# Patient Record
Sex: Male | Born: 1970 | Race: Black or African American | Hispanic: No | Marital: Married | Smoking: Never smoker
Health system: Southern US, Community
[De-identification: ages and names within clinical notes are randomized; demographics above are authoritative.]

## PROBLEM LIST (undated history)

## (undated) DIAGNOSIS — Z789 Other specified health status: Secondary | ICD-10-CM

## (undated) HISTORY — PX: WRIST SURGERY: SHX841

## (undated) HISTORY — PX: TONSILLECTOMY: SUR1361

---

## 2005-04-15 ENCOUNTER — Emergency Department: Payer: Self-pay | Admitting: Unknown Physician Specialty

## 2012-01-03 ENCOUNTER — Emergency Department: Payer: Self-pay | Admitting: Emergency Medicine

## 2015-01-28 ENCOUNTER — Ambulatory Visit: Payer: Worker's Compensation

## 2015-01-28 ENCOUNTER — Encounter: Payer: Self-pay | Admitting: Emergency Medicine

## 2015-01-28 ENCOUNTER — Ambulatory Visit: Admission: EM | Admit: 2015-01-28 | Discharge: 2015-01-28 | Discharge status: Absent without leave | Payer: Self-pay

## 2015-01-28 ENCOUNTER — Ambulatory Visit
Admission: EM | Admit: 2015-01-28 | Discharge: 2015-01-28 | Disposition: A | Discharge status: Home / Self Care | Payer: Worker's Compensation | Attending: Internal Medicine | Admitting: Internal Medicine

## 2015-01-28 DIAGNOSIS — M79601 Pain in right arm: Secondary | ICD-10-CM | POA: Diagnosis present

## 2015-01-28 DIAGNOSIS — M70811 Other soft tissue disorders related to use, overuse and pressure, right shoulder: Secondary | ICD-10-CM | POA: Diagnosis not present

## 2015-01-28 DIAGNOSIS — M7581 Other shoulder lesions, right shoulder: Secondary | ICD-10-CM | POA: Diagnosis not present

## 2015-01-28 DIAGNOSIS — M654 Radial styloid tenosynovitis [de Quervain]: Secondary | ICD-10-CM | POA: Diagnosis not present

## 2015-01-28 IMAGING — CR DG SHOULDER 2+V*R*
4 series · 4 of 4 positions shown · non-contrast
Comparison: None

CLINICAL DATA: Subacromial pain, pain at RIGHT humeral head
posteriorly and at lateral border of RIGHT scapular for 1 month in,
has had intermittent pain for 3 years, repetitive and movements at
work, no specific injury

EXAM:
RIGHT SHOULDER - 2+ VIEW

[shoulder axial (1 of 4)]
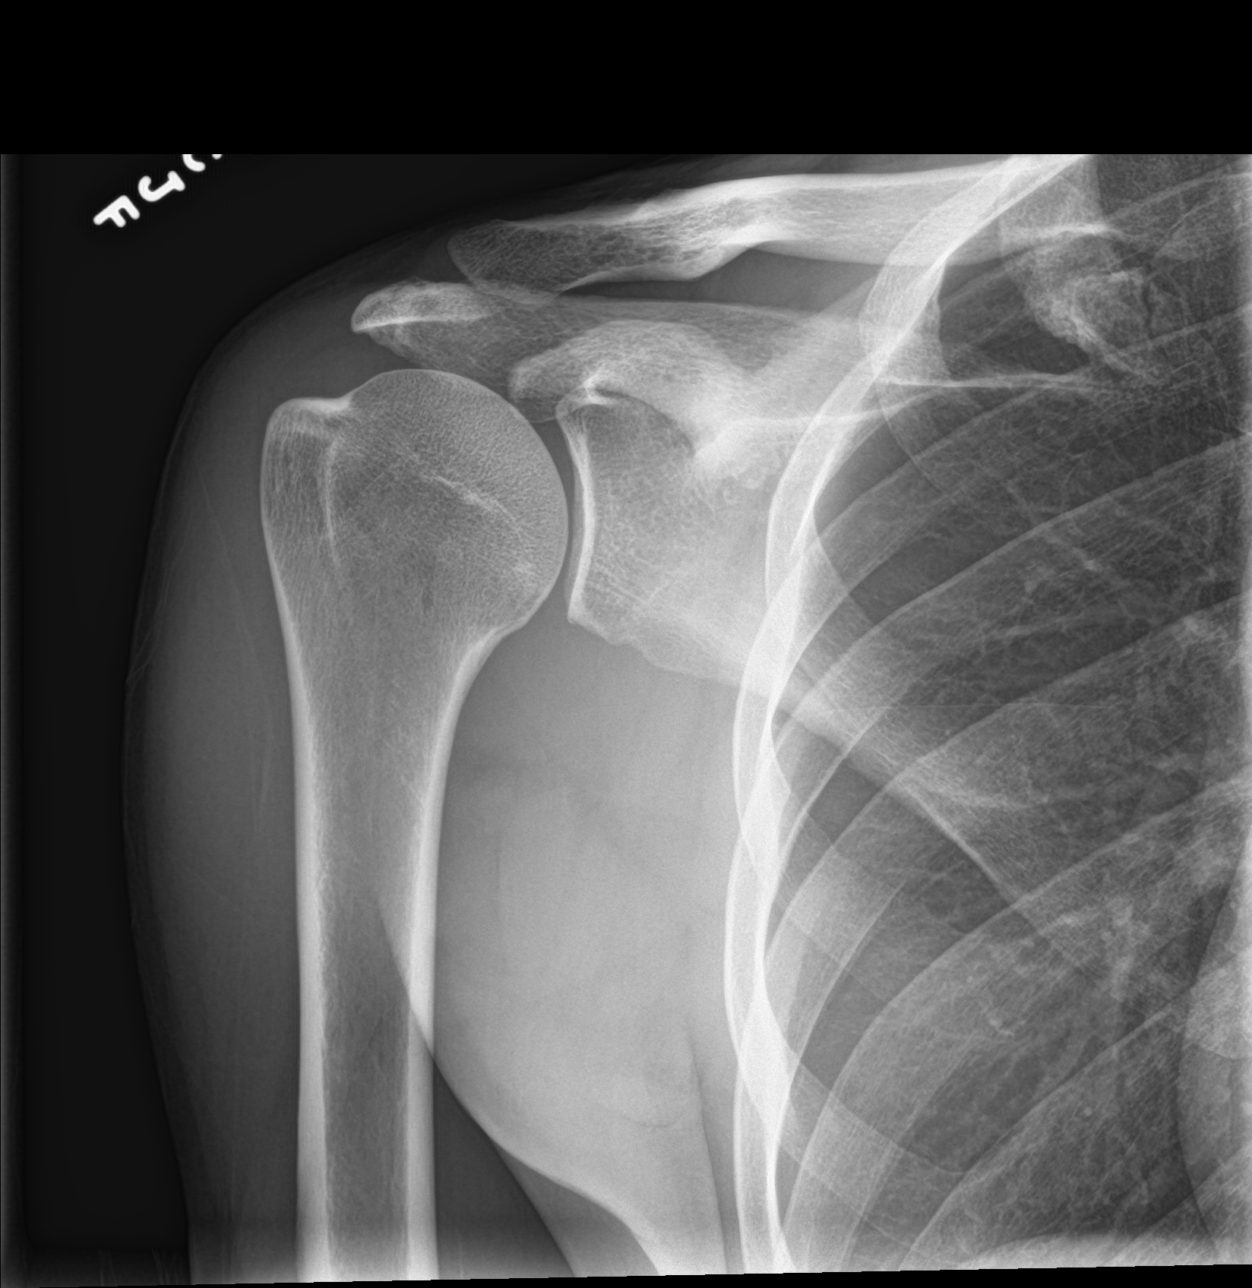

[shoulder axial (2 of 4)]
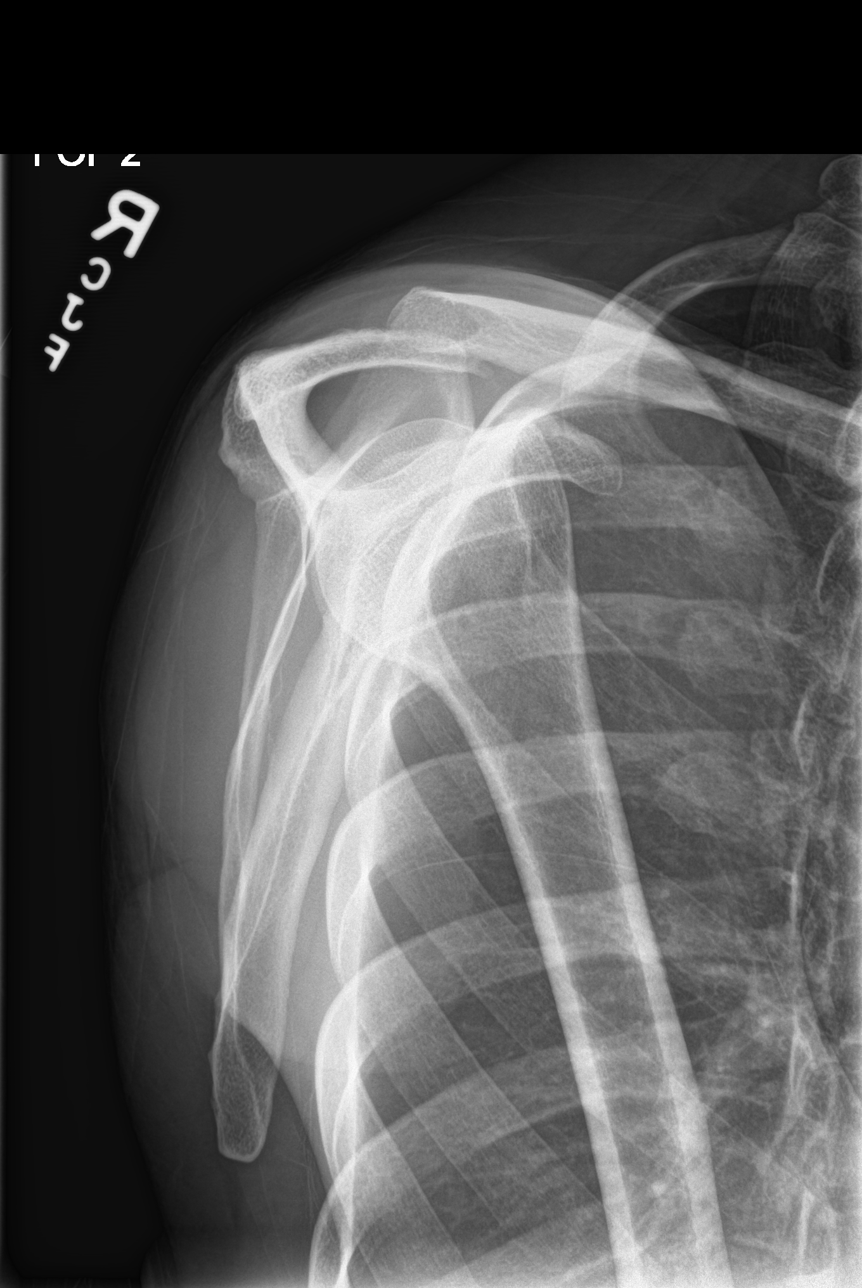

[shoulder axial (3 of 4)]
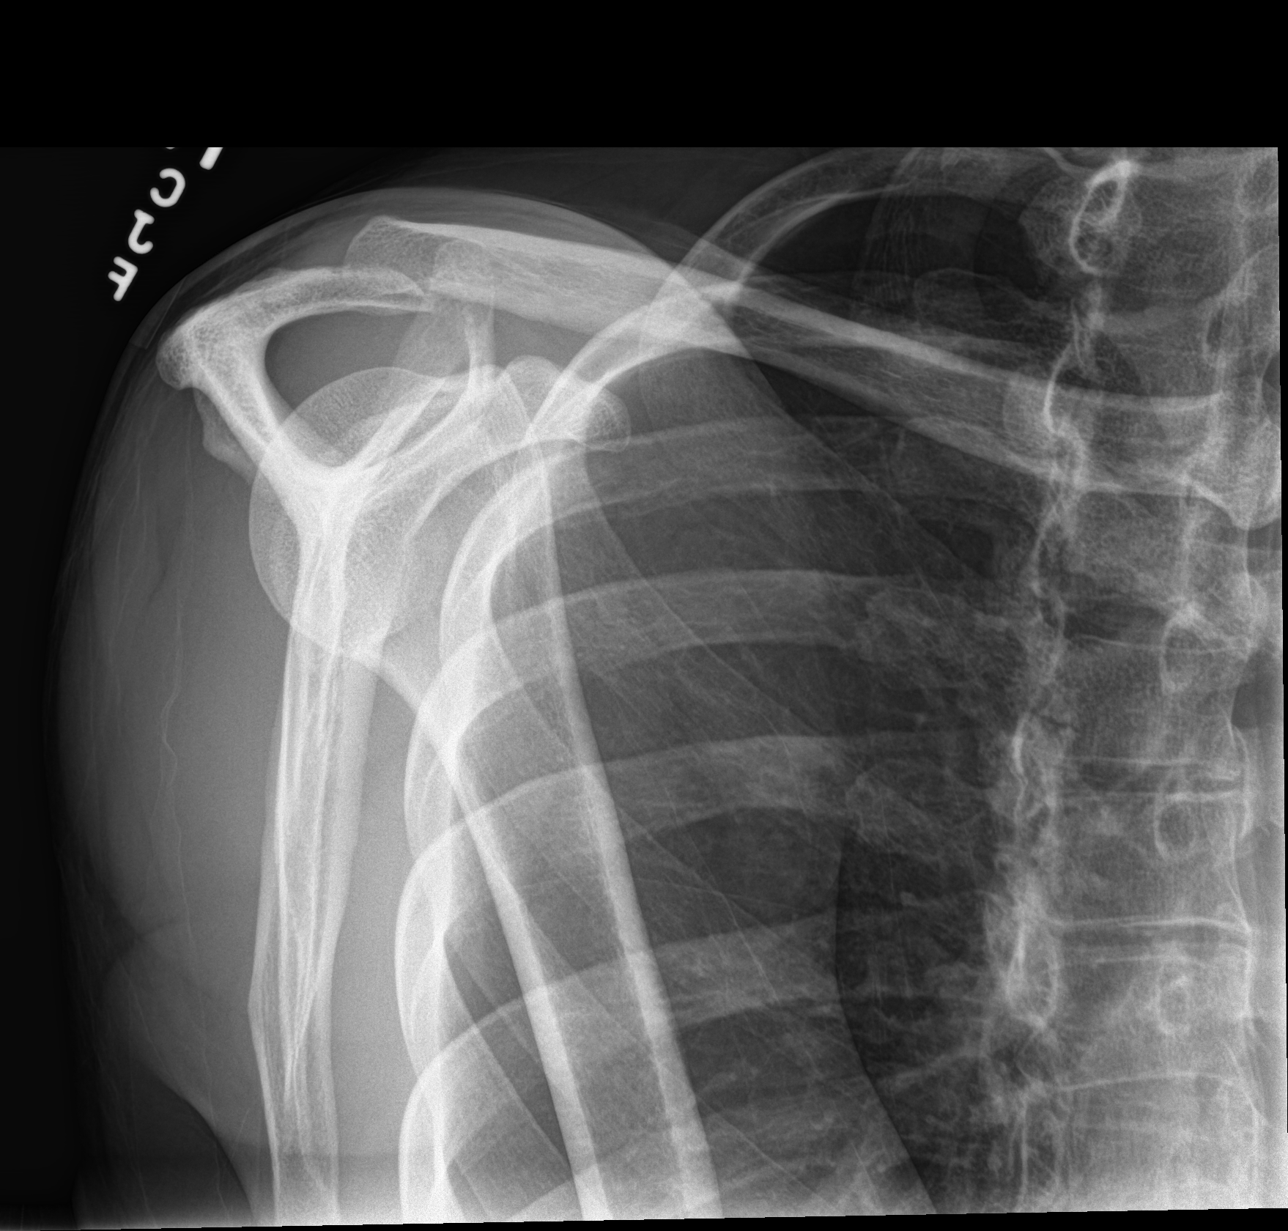

[shoulder axial (4 of 4)]
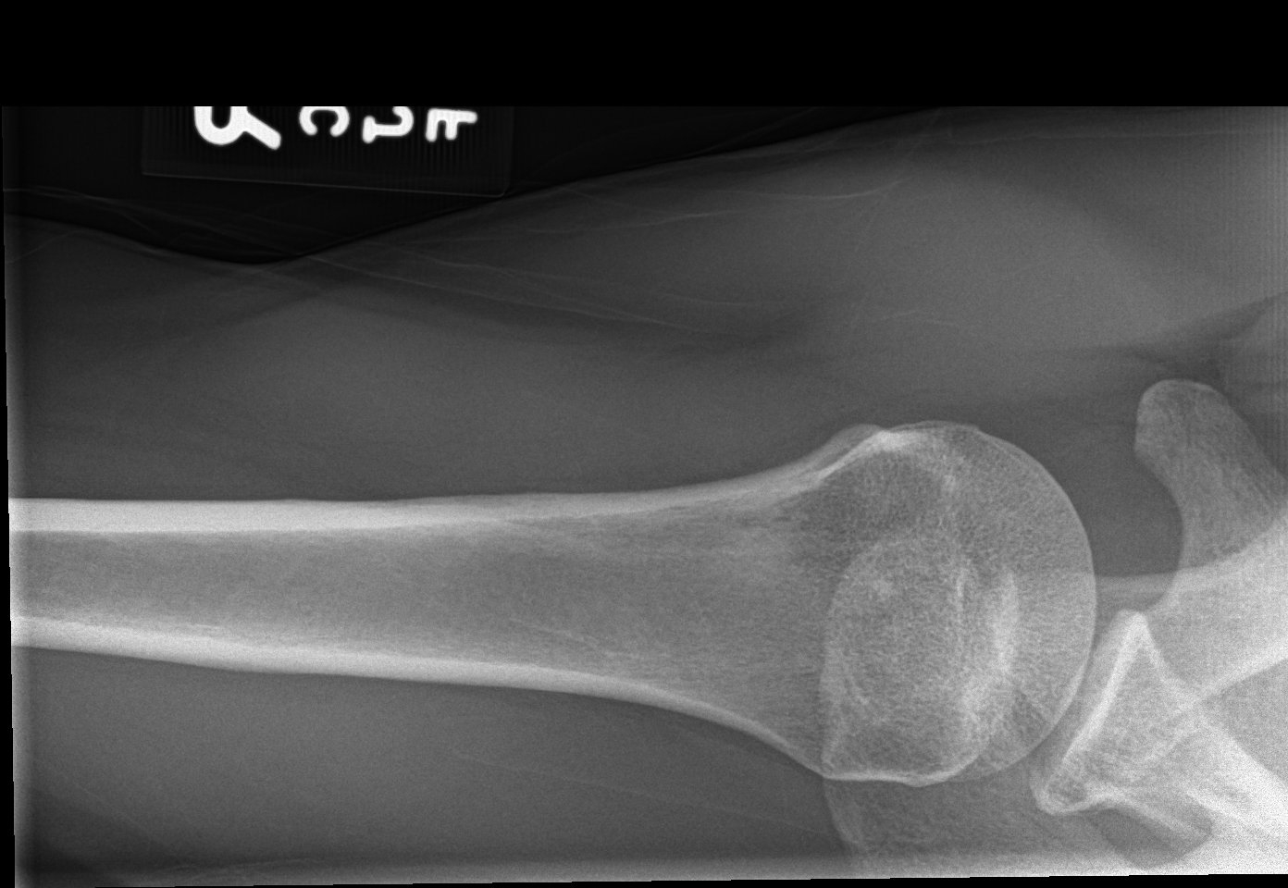

[4 of 4 positions shown; findings below may reference images not displayed]

FINDINGS: Osseous mineralization normal.

AC joint alignment normal.

No acute fracture, dislocation or bone destruction.

Visualized ribs unremarkable.
IMPRESSION: Normal exam.

## 2015-01-28 MED ORDER — NAPROXEN 500 MG PO TABS: 500.0000 mg | ORAL_TABLET | Freq: Two times a day (BID) | ORAL | Status: DC

## 2015-01-28 NOTE — ED Provider Notes (Signed)
CSN: [id number]     Arrival date & time 01/28/15  1115 History   First MD Initiated Contact with Patient 01/28/15 1243     Chief Complaint  Patient presents with  . Arm Pain   (Consider location/radiation/quality/duration/timing/severity/associated sxs/prior Treatment) HPI   This a 44 year old male who works at [employer] of [employer] he fit into a machine. He states that they've increased her work load since February he's been having to pound wires into a block of rubber forcefully with a mallet at times when they become stuck. For the last month he has been having right shoulder pain that hurts with abduction and right radial wrist pain when he is using the hammer. Is also noticed some swelling along the radial border of his proximal wrist. The pain is worsened and he has tried taking Tylenol without success. He did injure his shoulder in the past not as a work related injury but required a cortisone injection or improvement. He does complain of intermittent numbness/ tingling over his proximal volar forearm History reviewed. No pertinent past medical history. Past Surgical History  Procedure Laterality Date  . Tonsillectomy     History reviewed. No pertinent family history. History  Substance Use Topics  . Smoking status: Never Smoker   . Smokeless tobacco: Never Used  . Alcohol Use: Yes     Comment: occasional    Review of Systems  Musculoskeletal: Positive for myalgias and joint swelling.  Neurological: Positive for numbness.  All other systems reviewed and are negative.   Allergies  Review of patient's allergies indicates no known allergies.  Home Medications   Prior to Admission medications   Medication Sig Start Date End Date Taking? Authorizing Provider  naproxen (NAPROSYN) 500 MG tablet Take 1 tablet (500 mg total) by mouth 2 (two) times daily with a meal. 01/28/15   Lorin Picket, PA-C   BP 109/69 mmHg  Pulse 58  Temp(Src) 96.5 F (35.8 C) (Tympanic)   Ht 5\' 11"  (1.803 m)  Wt 146 lb (66.225 kg)  BMI 20.37 kg/m2  SpO2 100% Physical Exam  Constitutional: He is oriented to person, place, and time. He appears well-developed and well-nourished.  HENT:  Head: Normocephalic and atraumatic.  Eyes: EOM are normal. Pupils are equal, round, and reactive to light.  Neck: Normal range of motion. Neck supple.  Musculoskeletal:  Examination of the right wrist shows good range of motion to flexion and extension pronation supination. There is tenderness over the distal radial styloid reproducing his pain. He has a positive Finkelstein's test.  Examination of the right shoulder shows some acromial tenderness. He has good range of motion with abduction and external rotation he feels the pain in the right shoulder. Upper sherry strength is intact sensation is intact to light touch there is a negative empty can sign and a negative arm drop test.  Lymphadenopathy:    He has no cervical adenopathy.  Neurological: He is alert and oriented to person, place, and time. He has normal reflexes.  Skin: Skin is warm and dry.  Psychiatric: He has a normal mood and affect. His behavior is normal. Judgment and thought content normal.  Nursing note and vitals reviewed.   ED Course  Procedures (including critical care time) Labs Review Labs Reviewed - No data to display  Imaging Review Dg Shoulder Right  01/28/2015   CLINICAL DATA:  Subacromial pain, pain at RIGHT humeral head posteriorly and at lateral border of RIGHT scapular for 1 month in, has  had intermittent pain for 3 years, repetitive and movements at work, no specific injury  EXAM: RIGHT SHOULDER - 2+ VIEW  COMPARISON:  None  FINDINGS: Osseous mineralization normal.  AC joint alignment normal.  No acute fracture, dislocation or bone destruction.  Visualized ribs unremarkable.  IMPRESSION: Normal exam.   Electronically Signed   By: Lavonia Dana M.D.   On: 01/28/2015 13:36    Radial gutter splint applied. Pt  instructed to return back to [place] in 2 weeks for re-evaluation MDM   1. Rotator cuff tendonitis, right   2. De Quervain's tenosynovitis, right    Discharge Medication List as of 01/28/2015  2:12 PM    START taking these medications   Details  naproxen (NAPROSYN) 500 MG tablet Take 1 tablet (500 mg total) by mouth 2 (two) times daily with a meal., Starting 01/28/2015, Until Discontinued, Print      Plan: 1. Test/x-ray results and diagnosis reviewed with patient 2. rx as per orders; risks, benefits, potential side effects reviewed with patient 3. Recommend supportive treatment with rest,use of splint for activities. 4. F/u 2 weeks for re eval and possible referral to hand surgeon    Lorin Picket, PA-C 01/28/15 1437

## 2015-01-28 NOTE — ED Notes (Signed)
Patient states he " has been having shoulder pain for a while but on Saturday had shooting pain down right arm and into hand, wrist was swollen"

## 2015-01-28 NOTE — Discharge Instructions (Signed)
De Quervain's Disease Leonard Henderson disease is a condition often seen in racquet sports where there is a soreness (inflammation) in the cord like structures (tendons) which attach muscle to bone on the thumb side of the wrist. There may be a tightening of the tissuesaround the tendons. This condition is often helped by giving up or modifying the activity which caused it. When conservative treatment does not help, surgery may be required. Conservative treatment could include changes in the activity which brought about the problem or made it worse. Anti-inflammatory medications and injections may be used to help decrease the inflammation and help with pain control. Your caregiver will help you determine which is best for you. DIAGNOSIS  Often the diagnosis (learning what is wrong) can be made by examination. Sometimes x-rays are required. HOME CARE INSTRUCTIONS   Apply ice to the sore area for 15-20 minutes, 03-04 times per day while awake. Put the ice in a plastic bag and place a towel between the bag of ice and your skin. This is especially helpful if it can be done after all activities involving the sore wrist.  Temporary splinting may help.  Only take over-the-counter or prescription medicines for pain, discomfort or fever as directed by your caregiver. SEEK MEDICAL CARE IF:   Pain relief is not obtained with medications, or if you have increasing pain and seem to be getting worse rather than better. MAKE SURE YOU:   Understand these instructions.  Will watch your condition.  Will get help right away if you are not doing well or get worse. Document Released: 04/14/2001 Document Revised: 10/12/2011 Document Reviewed: 11/22/2013 [facility] Patient Information 2015 [place], [place]. This information is not intended to replace advice given to you by your health care provider. Make sure you discuss any questions you have with your health care provider.  Impingement Syndrome, Rotator Cuff,  Bursitis with Rehab Impingement syndrome is a condition that involves inflammation of the tendons of the rotator cuff and the subacromial bursa, that causes pain in the shoulder. The rotator cuff consists of four tendons and muscles that control much of the shoulder and upper arm function. The subacromial bursa is a fluid filled sac that helps reduce friction between the rotator cuff and one of the bones of the shoulder (acromion). Impingement syndrome is usually an overuse injury that causes swelling of the bursa (bursitis), swelling of the tendon (tendonitis), and/or a tear of the tendon (strain). Strains are classified into three categories. Grade 1 strains cause pain, but the tendon is not lengthened. Grade 2 strains include a lengthened ligament, due to the ligament being stretched or partially ruptured. With grade 2 strains there is still function, although the function may be decreased. Grade 3 strains include a complete tear of the tendon or muscle, and function is usually impaired. SYMPTOMS   Pain around the shoulder, often at the outer portion of the upper arm.  Pain that gets worse with shoulder function, especially when reaching overhead or lifting.  Sometimes, aching when not using the arm.  Pain that wakes you up at night.  Sometimes, tenderness, swelling, warmth, or redness over the affected area.  Loss of strength.  Limited motion of the shoulder, especially reaching behind the back (to the back pocket or to unhook bra) or across your body.  Crackling sound (crepitation) when moving the arm.  Biceps tendon pain and inflammation (in the front of the shoulder). Worse when bending the elbow or lifting. CAUSES  Impingement syndrome is often an overuse  injury, in which chronic (repetitive) motions cause the tendons or bursa to become inflamed. A strain occurs when a force is paced on the tendon or muscle that is greater than it can withstand. Common mechanisms of injury  include: Stress from sudden increase in duration, frequency, or intensity of training.  Direct hit (trauma) to the shoulder.  Aging, erosion of the tendon with normal use.  Bony bump on shoulder (acromial spur). RISK INCREASES WITH:  Contact sports (football, wrestling, boxing).  Throwing sports (baseball, tennis, volleyball).  Weightlifting and bodybuilding.  Heavy labor.  Previous injury to the rotator cuff, including impingement.  Poor shoulder strength and flexibility.  Failure to warm up properly before activity.  Inadequate protective equipment.  Old age.  Bony bump on shoulder (acromial spur). PREVENTION   Warm up and stretch properly before activity.  Allow for adequate recovery between workouts.  Maintain physical fitness:  Strength, flexibility, and endurance.  Cardiovascular fitness.  Learn and use proper exercise technique. PROGNOSIS  If treated properly, impingement syndrome usually goes away within 6 weeks. Sometimes surgery is required.  RELATED COMPLICATIONS   Longer healing time if not properly treated, or if not given enough time to heal.  Recurring symptoms, that result in a chronic condition.  Shoulder stiffness, frozen shoulder, or loss of motion.  Rotator cuff tendon tear.  Recurring symptoms, especially if activity is resumed too soon, with overuse, with a direct blow, or when using poor technique. TREATMENT  Treatment first involves the use of ice and medicine, to reduce pain and inflammation. The use of strengthening and stretching exercises may help reduce pain with activity. These exercises may be performed at home or with a therapist. If non-surgical treatment is unsuccessful after more than 6 months, surgery may be advised. After surgery and rehabilitation, activity is usually possible in 3 months.  MEDICATION  If pain medicine is needed, nonsteroidal anti-inflammatory medicines (aspirin and ibuprofen), or other minor pain  relievers (acetaminophen), are often advised.  Do not take pain medicine for 7 days before surgery.  Prescription pain relievers may be given, if your caregiver thinks they are needed. Use only as directed and only as much as you need.  Corticosteroid injections may be given by your caregiver. These injections should be reserved for the most serious cases, because they may only be given a certain number of times. HEAT AND COLD  Cold treatment (icing) should be applied for 10 to 15 minutes every 2 to 3 hours for inflammation and pain, and immediately after activity that aggravates your symptoms. Use ice packs or an ice massage.  Heat treatment may be used before performing stretching and strengthening activities prescribed by your caregiver, physical therapist, or athletic trainer. Use a heat pack or a warm water soak. SEEK MEDICAL CARE IF:   Symptoms get worse or do not improve in 4 to 6 weeks, despite treatment.  New, unexplained symptoms develop. (Drugs used in treatment may produce side effects.) EXERCISES  RANGE OF MOTION (ROM) AND STRETCHING EXERCISES - Impingement Syndrome (Rotator Cuff  Tendinitis, Bursitis) These exercises may help you when beginning to rehabilitate your injury. Your symptoms may go away with or without further involvement from your physician, physical therapist or athletic trainer. While completing these exercises, remember:   Restoring tissue flexibility helps normal motion to return to the joints. This allows healthier, less painful movement and activity.  An effective stretch should be held for at least 30 seconds.  A stretch should never be painful. You should  only feel a gentle lengthening or release in the stretched tissue. STRETCH - Flexion, Standing  Stand with good posture. With an underhand grip on your right / left hand, and an overhand grip on the opposite hand, grasp a broomstick or cane so that your hands are a little more than shoulder width  apart.  Keeping your right / left elbow straight and shoulder muscles relaxed, push the stick with your opposite hand, to raise your right / left arm in front of your body and then overhead. Raise your arm until you feel a stretch in your right / left shoulder, but before you have increased shoulder pain.  Try to avoid shrugging your right / left shoulder as your arm rises, by keeping your shoulder blade tucked down and toward your mid-back spine. Hold for __________ seconds.  Slowly return to the starting position. Repeat __________ times. Complete this exercise __________ times per day. STRETCH - Abduction, Supine  Lie on your back. With an underhand grip on your right / left hand and an overhand grip on the opposite hand, grasp a broomstick or cane so that your hands are a little more than shoulder width apart.  Keeping your right / left elbow straight and your shoulder muscles relaxed, push the stick with your opposite hand, to raise your right / left arm out to the side of your body and then overhead. Raise your arm until you feel a stretch in your right / left shoulder, but before you have increased shoulder pain.  Try to avoid shrugging your right / left shoulder as your arm rises, by keeping your shoulder blade tucked down and toward your mid-back spine. Hold for __________ seconds.  Slowly return to the starting position. Repeat __________ times. Complete this exercise __________ times per day. ROM - Flexion, Active-Assisted  Lie on your back. You may bend your knees for comfort.  Grasp a broomstick or cane so your hands are about shoulder width apart. Your right / left hand should grip the end of the stick, so that your hand is positioned "thumbs-up," as if you were about to shake hands.  Using your healthy arm to lead, raise your right / left arm overhead, until you feel a gentle stretch in your shoulder. Hold for __________ seconds.  Use the stick to assist in returning your  right / left arm to its starting position. Repeat __________ times. Complete this exercise __________ times per day.  ROM - Internal Rotation, Supine   Lie on your back on a firm surface. Place your right / left elbow about 60 degrees away from your side. Elevate your elbow with a folded towel, so that the elbow and shoulder are the same height.  Using a broomstick or cane and your strong arm, pull your right / left hand toward your body until you feel a gentle stretch, but no increase in your shoulder pain. Keep your shoulder and elbow in place throughout the exercise.  Hold for __________ seconds. Slowly return to the starting position. Repeat __________ times. Complete this exercise __________ times per day. STRETCH - Internal Rotation  Place your right / left hand behind your back, palm up.  Throw a towel or belt over your opposite shoulder. Grasp the towel with your right / left hand.  While keeping an upright posture, gently pull up on the towel, until you feel a stretch in the front of your right / left shoulder.  Avoid shrugging your right / left shoulder as your arm  rises, by keeping your shoulder blade tucked down and toward your mid-back spine.  Hold for __________ seconds. Release the stretch, by lowering your healthy hand. Repeat __________ times. Complete this exercise __________ times per day. ROM - Internal Rotation   Using an underhand grip, grasp a stick behind your back with both hands.  While standing upright with good posture, slide the stick up your back until you feel a mild stretch in the front of your shoulder.  Hold for __________ seconds. Slowly return to your starting position. Repeat __________ times. Complete this exercise __________ times per day.  STRETCH - Posterior Shoulder Capsule   Stand or sit with good posture. Grasp your right / left elbow and draw it across your chest, keeping it at the same height as your shoulder.  Pull your elbow, so your  upper arm comes in closer to your chest. Pull until you feel a gentle stretch in the back of your shoulder.  Hold for __________ seconds. Repeat __________ times. Complete this exercise __________ times per day. STRENGTHENING EXERCISES - Impingement Syndrome (Rotator Cuff Tendinitis, Bursitis) These exercises may help you when beginning to rehabilitate your injury. They may resolve your symptoms with or without further involvement from your physician, physical therapist or athletic trainer. While completing these exercises, remember:  Muscles can gain both the endurance and the strength needed for everyday activities through controlled exercises.  Complete these exercises as instructed by your physician, physical therapist or athletic trainer. Increase the resistance and repetitions only as guided.  You may experience muscle soreness or fatigue, but the pain or discomfort you are trying to eliminate should never worsen during these exercises. If this pain does get worse, stop and make sure you are following the directions exactly. If the pain is still present after adjustments, discontinue the exercise until you can discuss the trouble with your clinician.  During your recovery, avoid activity or exercises which involve actions that place your injured hand or elbow above your head or behind your back or head. These positions stress the tissues which you are trying to heal. STRENGTH - Scapular Depression and Adduction   With good posture, sit on a firm chair. Support your arms in front of you, with pillows, arm rests, or on a table top. Have your elbows in line with the sides of your body.  Gently draw your shoulder blades down and toward your mid-back spine. Gradually increase the tension, without tensing the muscles along the top of your shoulders and the back of your neck.  Hold for __________ seconds. Slowly release the tension and relax your muscles completely before starting the next  repetition.  After you have practiced this exercise, remove the arm support and complete the exercise in standing as well as sitting position. Repeat __________ times. Complete this exercise __________ times per day.  STRENGTH - Shoulder Abductors, Isometric  With good posture, stand or sit about 4-6 inches from a wall, with your right / left side facing the wall.  Bend your right / left elbow. Gently press your right / left elbow into the wall. Increase the pressure gradually, until you are pressing as hard as you can, without shrugging your shoulder or increasing any shoulder discomfort.  Hold for __________ seconds.  Release the tension slowly. Relax your shoulder muscles completely before you begin the next repetition. Repeat __________ times. Complete this exercise __________ times per day.  STRENGTH - External Rotators, Isometric  Keep your right / left elbow at your side  and bend it 90 degrees.  Step into a door frame so that the outside of your right / left wrist can press against the door frame without your upper arm leaving your side.  Gently press your right / left wrist into the door frame, as if you were trying to swing the back of your hand away from your stomach. Gradually increase the tension, until you are pressing as hard as you can, without shrugging your shoulder or increasing any shoulder discomfort.  Hold for __________ seconds.  Release the tension slowly. Relax your shoulder muscles completely before you begin the next repetition. Repeat __________ times. Complete this exercise __________ times per day.  STRENGTH - Supraspinatus   Stand or sit with good posture. Grasp a __________ weight, or an exercise band or tubing, so that your hand is "thumbs-up," like you are shaking hands.  Slowly lift your right / left arm in a "V" away from your thigh, diagonally into the space between your side and straight ahead. Lift your hand to shoulder height or as far as you can,  without increasing any shoulder pain. At first, many people do not lift their hands above shoulder height.  Avoid shrugging your right / left shoulder as your arm rises, by keeping your shoulder blade tucked down and toward your mid-back spine.  Hold for __________ seconds. Control the descent of your hand, as you slowly return to your starting position. Repeat __________ times. Complete this exercise __________ times per day.  STRENGTH - External Rotators  Secure a rubber exercise band or tubing to a fixed object (table, pole) so that it is at the same height as your right / left elbow when you are standing or sitting on a firm surface.  Stand or sit so that the secured exercise band is at your uninjured side.  Bend your right / left elbow 90 degrees. Place a folded towel or small pillow under your right / left arm, so that your elbow is a few inches away from your side.  Keeping the tension on the exercise band, pull it away from your body, as if pivoting on your elbow. Be sure to keep your body steady, so that the movement is coming only from your rotating shoulder.  Hold for __________ seconds. Release the tension in a controlled manner, as you return to the starting position. Repeat __________ times. Complete this exercise __________ times per day.  STRENGTH - Internal Rotators   Secure a rubber exercise band or tubing to a fixed object (table, pole) so that it is at the same height as your right / left elbow when you are standing or sitting on a firm surface.  Stand or sit so that the secured exercise band is at your right / left side.  Bend your elbow 90 degrees. Place a folded towel or small pillow under your right / left arm so that your elbow is a few inches away from your side.  Keeping the tension on the exercise band, pull it across your body, toward your stomach. Be sure to keep your body steady, so that the movement is coming only from your rotating shoulder.  Hold for  __________ seconds. Release the tension in a controlled manner, as you return to the starting position. Repeat __________ times. Complete this exercise __________ times per day.  STRENGTH - Scapular Protractors, Standing   Stand arms length away from a wall. Place your hands on the wall, keeping your elbows straight.  Begin by dropping  your shoulder blades down and toward your mid-back spine.  To strengthen your protractors, keep your shoulder blades down, but slide them forward on your rib cage. It will feel as if you are lifting the back of your rib cage away from the wall. This is a subtle motion and can be challenging to complete. Ask your caregiver for further instruction, if you are not sure you are doing the exercise correctly.  Hold for __________ seconds. Slowly return to the starting position, resting the muscles completely before starting the next repetition. Repeat __________ times. Complete this exercise __________ times per day. STRENGTH - Scapular Protractors, Supine  Lie on your back on a firm surface. Extend your right / left arm straight into the air while holding a __________ weight in your hand.  Keeping your head and back in place, lift your shoulder off the floor.  Hold for __________ seconds. Slowly return to the starting position, and allow your muscles to relax completely before starting the next repetition. Repeat __________ times. Complete this exercise __________ times per day. STRENGTH - Scapular Protractors, Quadruped  Get onto your hands and knees, with your shoulders directly over your hands (or as close as you can be, comfortably).  Keeping your elbows locked, lift the back of your rib cage up into your shoulder blades, so your mid-back rounds out. Keep your neck muscles relaxed.  Hold this position for __________ seconds. Slowly return to the starting position and allow your muscles to relax completely before starting the next repetition. Repeat __________  times. Complete this exercise __________ times per day.  STRENGTH - Scapular Retractors  Secure a rubber exercise band or tubing to a fixed object (table, pole), so that it is at the height of your shoulders when you are either standing, or sitting on a firm armless chair.  With a palm down grip, grasp an end of the band in each hand. Straighten your elbows and lift your hands straight in front of you, at shoulder height. Step back, away from the secured end of the band, until it becomes tense.  Squeezing your shoulder blades together, draw your elbows back toward your sides, as you bend them. Keep your upper arms lifted away from your body throughout the exercise.  Hold for __________ seconds. Slowly ease the tension on the band, as you reverse the directions and return to the starting position. Repeat __________ times. Complete this exercise __________ times per day. STRENGTH - Shoulder Extensors   Secure a rubber exercise band or tubing to a fixed object (table, pole) so that it is at the height of your shoulders when you are either standing, or sitting on a firm armless chair.  With a thumbs-up grip, grasp an end of the band in each hand. Straighten your elbows and lift your hands straight in front of you, at shoulder height. Step back, away from the secured end of the band, until it becomes tense.  Squeezing your shoulder blades together, pull your hands down to the sides of your thighs. Do not allow your hands to go behind you.  Hold for __________ seconds. Slowly ease the tension on the band, as you reverse the directions and return to the starting position. Repeat __________ times. Complete this exercise __________ times per day.  STRENGTH - Scapular Retractors and External Rotators   Secure a rubber exercise band or tubing to a fixed object (table, pole) so that it is at the height as your shoulders, when you are either standing, or  sitting on a firm armless chair.  With a palm down  grip, grasp an end of the band in each hand. Bend your elbows 90 degrees and lift your elbows to shoulder height, at your sides. Step back, away from the secured end of the band, until it becomes tense.  Squeezing your shoulder blades together, rotate your shoulders so that your upper arms and elbows remain stationary, but your fists travel upward to head height.  Hold for __________ seconds. Slowly ease the tension on the band, as you reverse the directions and return to the starting position. Repeat __________ times. Complete this exercise __________ times per day.  STRENGTH - Scapular Retractors and External Rotators, Rowing   Secure a rubber exercise band or tubing to a fixed object (table, pole) so that it is at the height of your shoulders, when you are either standing, or sitting on a firm armless chair.  With a palm down grip, grasp an end of the band in each hand. Straighten your elbows and lift your hands straight in front of you, at shoulder height. Step back, away from the secured end of the band, until it becomes tense.  Step 1: Squeeze your shoulder blades together. Bending your elbows, draw your hands to your chest, as if you are rowing a boat. At the end of this motion, your hands and elbow should be at shoulder height and your elbows should be out to your sides.  Step 2: Rotate your shoulders, to raise your hands above your head. Your forearms should be vertical and your upper arms should be horizontal.  Hold for __________ seconds. Slowly ease the tension on the band, as you reverse the directions and return to the starting position. Repeat __________ times. Complete this exercise __________ times per day.  STRENGTH - Scapular Depressors  Find a sturdy chair without wheels, such as a dining room chair.  Keeping your feet on the floor, and your hands on the chair arms, lift your bottom up from the seat, and lock your elbows.  Keeping your elbows straight, allow gravity to  pull your body weight down. Your shoulders will rise toward your ears.  Raise your body against gravity by drawing your shoulder blades down your back, shortening the distance between your shoulders and ears. Although your feet should always maintain contact with the floor, your feet should progressively support less body weight, as you get stronger.  Hold for __________ seconds. In a controlled and slow manner, lower your body weight to begin the next repetition. Repeat __________ times. Complete this exercise __________ times per day.  Document Released: 07/20/2005 Document Revised: 10/12/2011 Document Reviewed: 11/01/2008 [facility] Patient Information 2015 [place], [place]. This information is not intended to replace advice given to you by your health care provider. Make sure you discuss any questions you have with your health care provider.  Biceps Tendon Subluxation Tendons attach muscle to bone. The bicep muscles (the muscles that flex the forearm at the elbow joint and assist in flexing the arm at the shoulder joint) have three tendons. One tendon attaches at the elbow and the other two at the shoulder. One of the tendons that attaches at the shoulder (the long head) runs through a groove in the bone before it enters the shoulder joint. The groove is covered by the transverse humeral ligament, which helps keep the biceps tendon in the groove. Bicep tendon subluxation occurs when the tendon moves in and out of this groove. Bicep tendon subluxation usually occurs in  the presence of another shoulder condition such as a tear of the subscapularis tendon (a tendon of one of the rotator cuff muscles). SYMPTOMS  Presence of a "clunking" feeling when the arm is rotated outward passively or inwardly against resistance.  Pain and tenderness in the front of the shoulder.  Pain that increases with shoulder and elbow motion such as bending the elbow and turning ones palm upwards against resistance.  A  crackling sound (crepitation) when the shoulder is moved. CAUSES  Injury to the rotator cuff, either traumatic or degenerative.  RISK INCREASES WITH:  Contact sports, throwing sports, weightlifting, and bodybuilding.  Heavy labor especially involving lifting.  Poor strength and flexibility.  Failure to warm-up properly before practice or play. PREVENTION  Appropriately warm up and stretch before practice or competition.  Allow time for rest and recovery.  Maintain appropriate conditioning:  Shoulder and elbow flexibility.  Muscle strength and endurance.  Cardiovascular fitness.  Learn and use proper technique, especially in throwing sports. PROGNOSIS  Surgical repair of the rotator cuff with repair of the transverse ligament typically reinstates full stability of the biceps tendon.  RELATED COMPLICATIONS   If activity is begun too early.  Recurrent symptoms.  Prolonged healing time.  Persistent subluxation with shoulder and elbow function.  Weakness of elbow bending and forearm rotation.  Prolonged disability (uncommon).  Shoulder pain.  Stiffness or loss of motion of the shoulder. TREATMENT Initially, pain should be managed with non-prescription medication and ice. Individuals should begin stretching exercises and learn proper technique for the activity that caused the injury. The exercises may be conducted at home or under the supervision of a physical therapist. Typically surgical repair of the rotator cuff and reconstruction of the transverse humeral ligament are recommended. On occasion, cutting of the bicep tendon and stitching (suturing) it to the bone of the upper arm (tenodesis) may be performed. MEDICATION  If pain medication is necessary, nonsteroidal anti-inflammatory medications, such as aspirin and ibuprofen, or other minor pain relievers, such as acetaminophen, are often recommended.  Do not take pain medication for 7 days before  surgery.  Prescription pain relievers are usually only prescribed after surgery. Use only as directed and only as much as you need. HEAT AND COLD   Cold treatment (icing) relieves pain and reduces inflammation. Cold treatment should be applied for 10 to 15 minutes every 2 to 3 hours for inflammation and pain and immediately after any activity that aggravates your symptoms. Use ice packs or an ice massage.  Heat treatment may be used prior to performing the stretching and strengthening activities prescribed by your caregiver, physical therapist, or athletic trainer. Use a heat pack or a warm soak. SEEK MEDICAL CARE IF:   Symptoms get worse or do not improve in 4 to 6 weeks despite treatment.  New, unexplained symptoms develop (drugs used in treatment may produce side effects). Document Released: 07/20/2005 Document Revised: 10/12/2011 Document Reviewed: 11/01/2008 [facility] Patient Information 2015 [place], [place]. This information is not intended to replace advice given to you by your health care provider. Make sure you discuss any questions you have with your health care provider.

## 2015-01-28 NOTE — ED Notes (Signed)
Radial gutter splint applied. Pt instructed to return back to [facility] in 2 weeks for re-evaluation

## 2015-02-06 ENCOUNTER — Ambulatory Visit
Admission: EM | Admit: 2015-02-06 | Discharge: 2015-02-06 | Disposition: A | Discharge status: Home / Self Care | Payer: Worker's Compensation | Attending: Family Medicine | Admitting: Family Medicine

## 2015-02-06 ENCOUNTER — Encounter: Payer: Self-pay | Admitting: Emergency Medicine

## 2015-02-06 ENCOUNTER — Ambulatory Visit: Payer: Worker's Compensation

## 2015-02-06 DIAGNOSIS — M25531 Pain in right wrist: Secondary | ICD-10-CM | POA: Diagnosis not present

## 2015-02-06 IMAGING — CR DG WRIST COMPLETE 3+V*R*
4 series · 4 of 4 positions shown · non-contrast
Comparison: None.

CLINICAL DATA: Injured at work 2 weeks ago with pain in the right
wrist

EXAM:
RIGHT WRIST - COMPLETE 3+ VIEW

[wrist pa (1 of 2)]
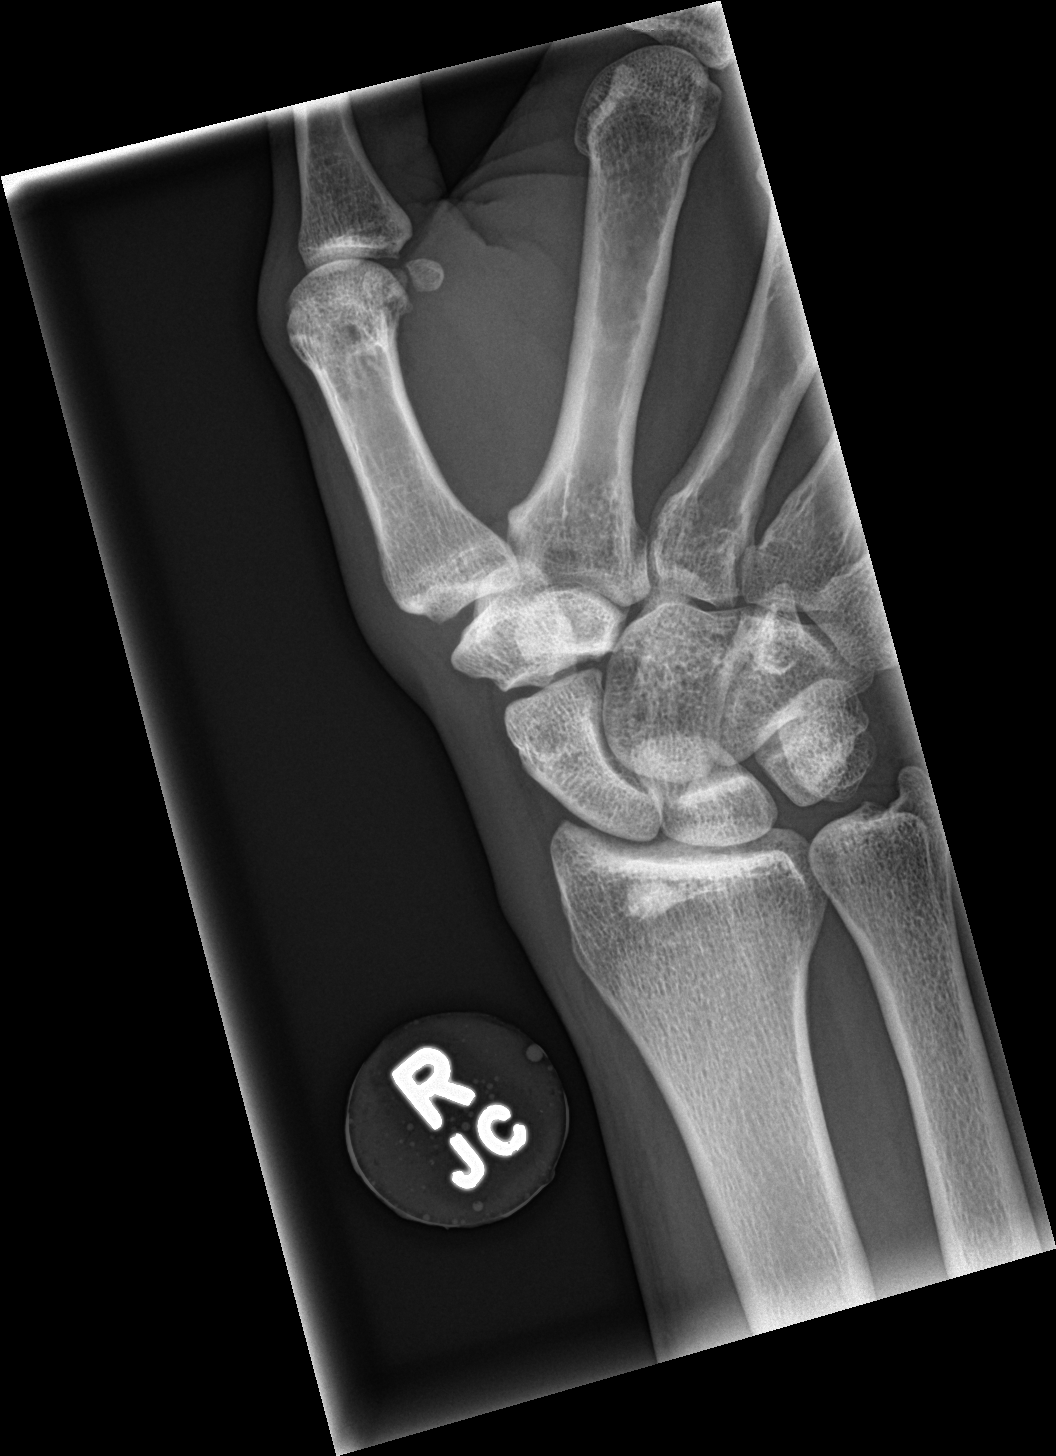

[wrist pa (2 of 2)]
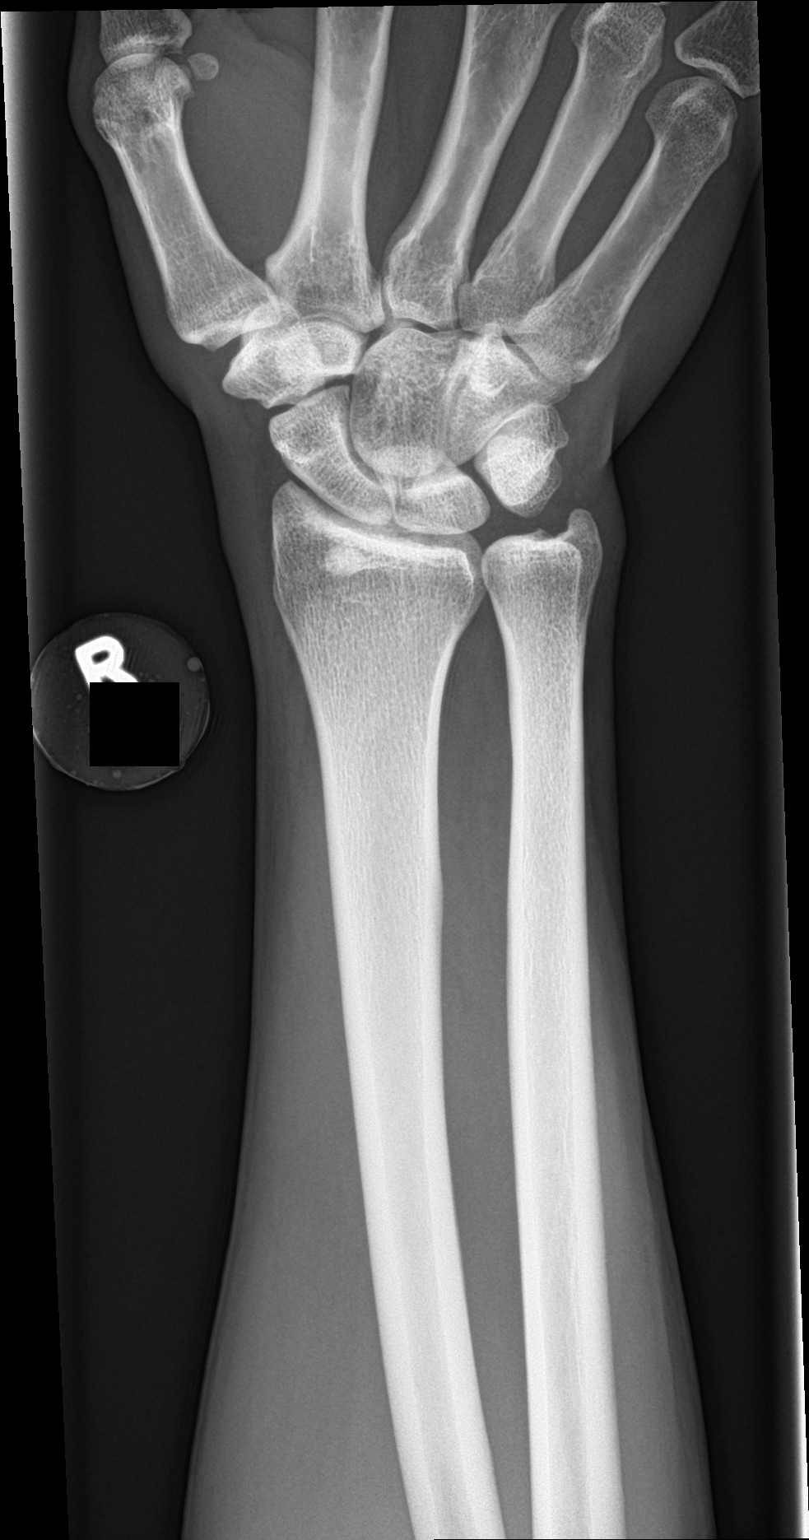

[wrist obl]
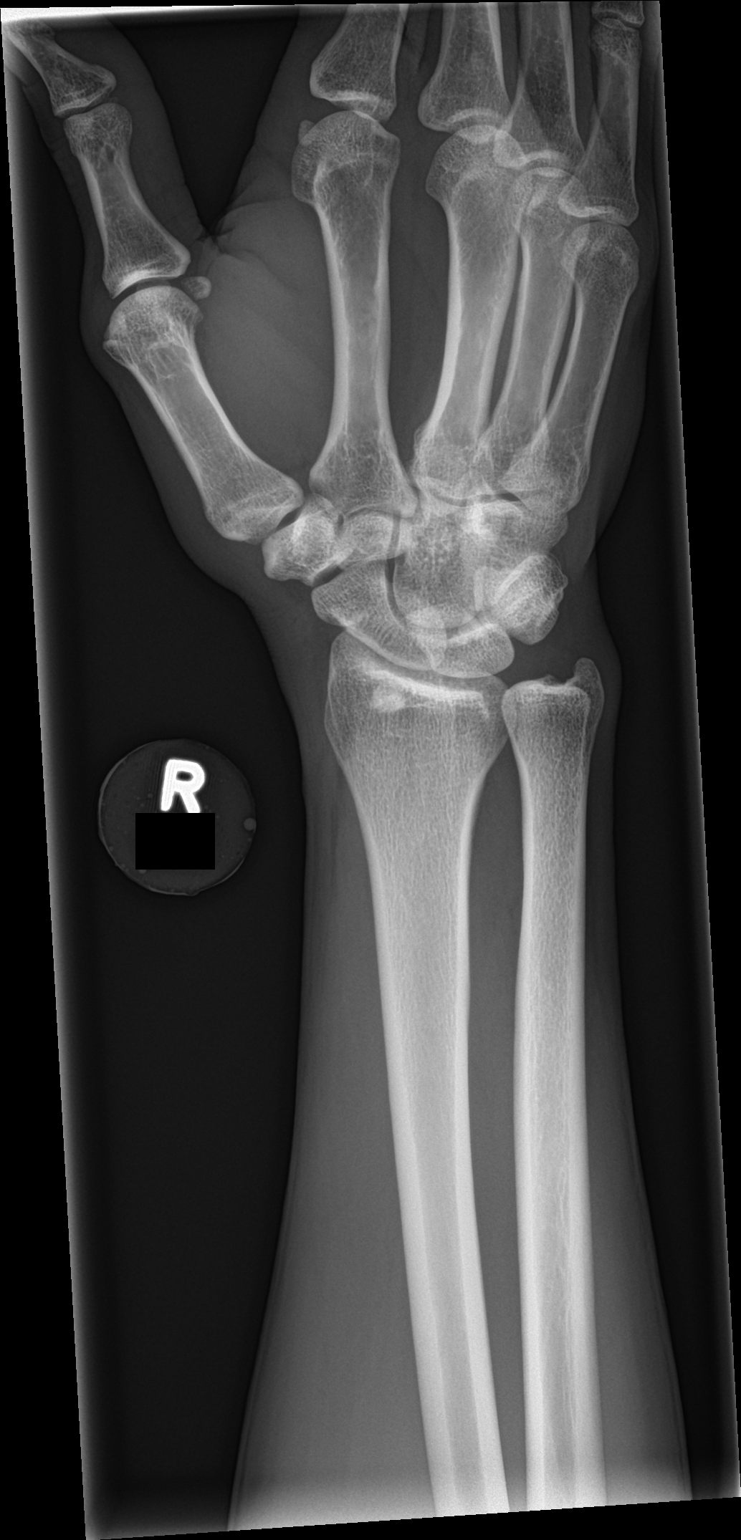

[wrist lat]
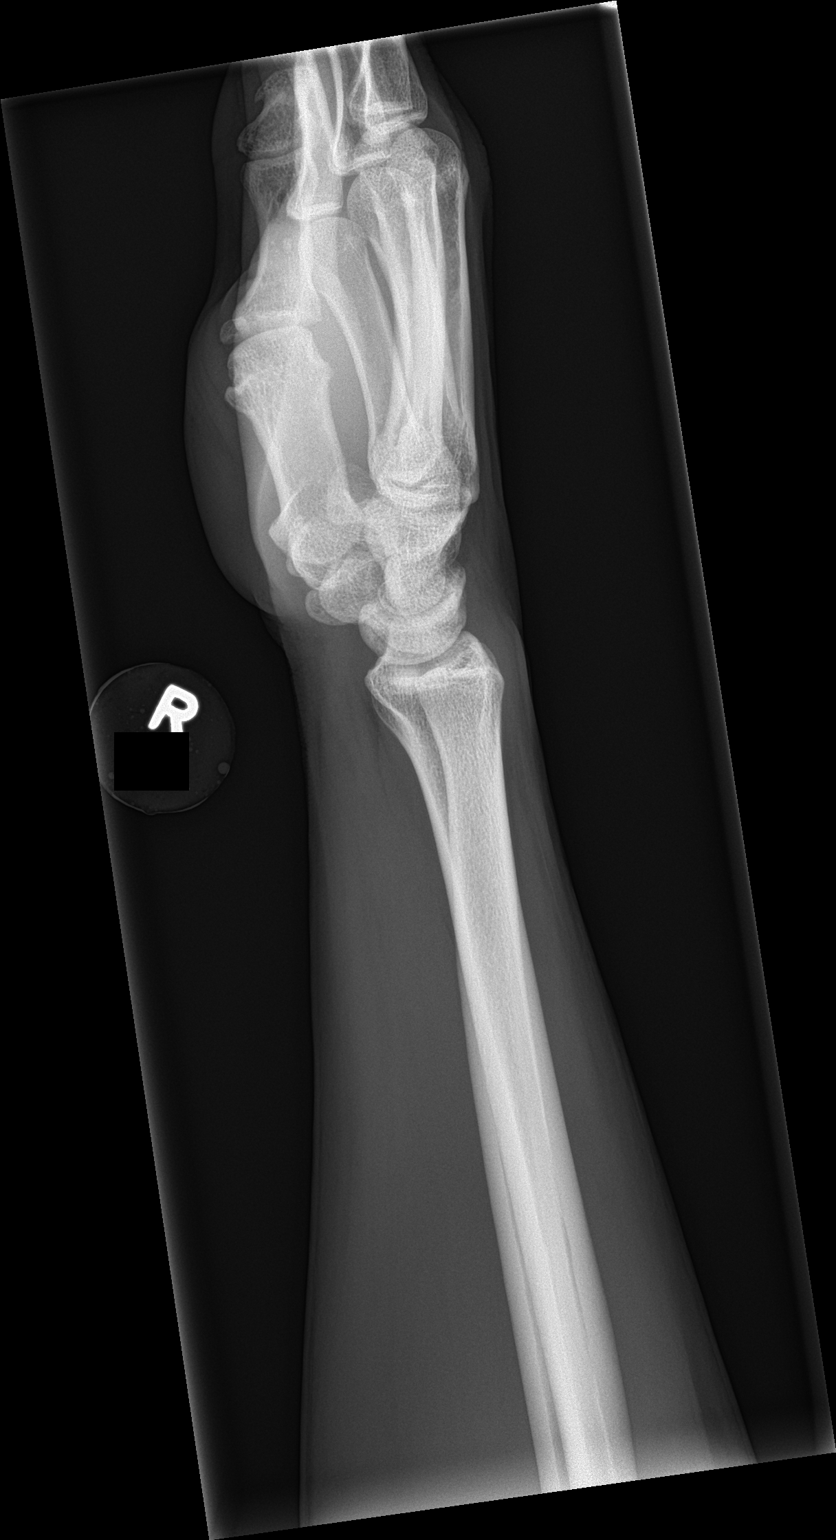

[4 of 4 positions shown; findings below may reference images not displayed]

FINDINGS: The right radiocarpal joint is unremarkable. The ulnar styloid is
intact. The carpal bones are in normal position. Normal alignment is
maintained. No acute abnormality is seen. A benign-appearing bone
island is noted within the distal right radius.
IMPRESSION: Negative.

## 2015-02-06 NOTE — ED Notes (Signed)
Patient here for worker's comp follow-up.  Patient states he injured his right wrist at his job.

## 2015-02-06 NOTE — Discharge Instructions (Signed)
Recommend follow-up with orthopedics. Has xrays on disc. Can continue to wear thumb spica splint.

## 2015-02-06 NOTE — ED Provider Notes (Signed)
Patient presents today for follow-up regarding his workman comp injury spell he 2 weeks ago. Patient states that he continues to have pain in his wrist area mostly. He has been wearing the splint that was made for him most hours out of the day. He denies any new symptoms. He has been using his left arm primarily at work. This was listed on his work restrictions. *Refer to previous note regarding how injury happened.   ROS negative except mentioned above  Vitals as per Epic General-NAD MSK-RUE: There is no obvious deformity, FROM, tenderness is localized to the right distal radius, minimal snuffbox tenderness, positive Finkelstein's, neurovascularly intact  A/P: Right wrist injury ([facility])-x-rays of the right wrist were done, there does not appear to be any fracture in the scaphoid, there is a rounded bone fragment noted likely not acute, recommend patient follow-up with orthopedics hand/wrist for further evaluation and treatment. X-rays were given to patient on a disc. I have encouraged the use of the thumb spica splint until evaluated by orthopedics. He is to continue his current work restrictions.  Paulina Fusi, MD 02/06/15 1022

## 2018-02-23 ENCOUNTER — Encounter: Payer: Self-pay | Admitting: Nurse Practitioner

## 2018-02-23 ENCOUNTER — Other Ambulatory Visit: Payer: Self-pay

## 2018-02-23 ENCOUNTER — Ambulatory Visit: Payer: Self-pay | Admitting: Nurse Practitioner

## 2018-02-23 VITALS — BP 115/74 | HR 55 | Temp 98.2°F | Ht 71.0 in | Wt 152.4 lb

## 2018-02-23 DIAGNOSIS — Z23 Encounter for immunization: Secondary | ICD-10-CM

## 2018-02-23 DIAGNOSIS — Z0000 Encounter for general adult medical examination without abnormal findings: Secondary | ICD-10-CM

## 2018-02-23 DIAGNOSIS — Z7689 Persons encountering health services in other specified circumstances: Secondary | ICD-10-CM

## 2018-02-23 NOTE — Progress Notes (Signed)
Subjective:    Patient ID: Leonard Henderson, male    DOB: 23-Apr-1971, 47 y.o.   MRN: [id number]  Leonard Henderson is a 47 y.o. male presenting on 02/23/2018 for Annual Exam (would like to get a prostate exam and labs drawn) and [facility] Provider Pt last seen by PCP many years ago.  Obtain records from orthopedics at [facility].  Other episodic care with     Prostate Exam Dad had prostate cancer was diagnosed at age 15.  Annual Physical Exam Patient has been feeling well.  They have no acute concerns today. Sleeps 8-9 hours per night uninterrupted.  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 Seatbelt: always Sunscreen: regularly during summer Prostate exam/PSA: desires today HIV: desires today  Optometry: 2 years ago Dentistry: every 6 months  VACCINES: Tetanus: due today Influenza: recommended  History reviewed. No pertinent past medical history.   Past Surgical History:  Procedure Laterality Date  . TONSILLECTOMY     Social History   Socioeconomic History  . Marital status: Married    Spouse name: Not on file  . Number of children: 3  . Years of education: Not on file  . Highest education level: High school graduate  Occupational History  . Occupation: cabinets    Comment: temp [employer]  Social Needs  . Financial resource strain: Not on file  . Food insecurity:    Worry: Not on file    Inability: Not on file  . Transportation needs:    Medical: Not on file    Non-medical: Not on file  Tobacco Use  . Smoking status: Never Smoker  . Smokeless tobacco: Never Used  Substance and Sexual Activity  . Alcohol use: Yes    Alcohol/week: 0.6 oz    Types: 1 Standard drinks or equivalent per week    Comment: occasional alcohol sometimes none in one month, sometimes up to 4 beverages.  . Drug use: Yes   Types: Marijuana    Comment: 2x per month over last 10 years  . Sexual activity: Yes    Birth control/protection: None  Lifestyle  . Physical activity:    Days per week: Not on file    Minutes per session: Not on file  . Stress: Not on file  Relationships  . Social connections:    Talks on phone: Not on file    Gets together: Not on file    Attends religious service: Not on file    Active member of club or organization: Not on file    Attends meetings of clubs or organizations: Not on file    Relationship status: Not on file  . Intimate partner violence:    Fear of current or ex partner: Not on file    Emotionally abused: Not on file    Physically abused: Not on file    Forced sexual activity: Not on file  Other Topics Concern  . Not on file  Social History Narrative  . Not on file   Family History  Problem Relation Age of Onset  . Hypertension Mother   . Prostate cancer Father 23  . Hypertension Brother   . Breast cancer Sister 48   No current outpatient medications on file prior to visit.   No current facility-administered medications on file prior to visit.     Review of Systems  Constitutional: Negative  for activity change, appetite change, fatigue and unexpected weight change.  HENT: Negative for congestion, hearing loss and trouble swallowing.   Eyes: Negative for visual disturbance.  Respiratory: Negative for choking, shortness of breath and wheezing.   Cardiovascular: Negative for chest pain and palpitations.  Gastrointestinal: Negative for abdominal pain, blood in stool, constipation and diarrhea.  Genitourinary: Negative for difficulty urinating, discharge, flank pain, genital sores, penile pain, penile swelling, scrotal swelling and testicular pain.  Musculoskeletal: Negative for arthralgias, back pain and myalgias.  Skin: Negative for color change, rash and wound.  Allergic/Immunologic: Negative for environmental allergies.  Neurological: Negative for  dizziness, seizures, weakness and headaches.  Psychiatric/Behavioral: Negative for behavioral problems, decreased concentration, dysphoric mood, sleep disturbance and suicidal ideas. The patient is not nervous/anxious.    Per HPI unless specifically indicated above     Objective:    BP 115/74 (BP Location: Left Arm, Patient Position: Sitting, Cuff Size: Normal)   Pulse (!) 55   Temp 98.2 F (36.8 C) (Oral)   Ht 5\' 11"  (1.803 m)   Wt 152 lb 6.4 oz (69.1 kg)   BMI 21.26 kg/m   Wt Readings from Last 3 Encounters:  02/23/18 152 lb 6.4 oz (69.1 kg)  02/06/15 147 lb (66.7 kg)  01/28/15 146 lb (66.2 kg)    Physical Exam  Constitutional: He is oriented to person, place, and time. He appears well-developed and well-nourished. No distress.  HENT:  Head: Normocephalic and atraumatic.  Right Ear: External ear normal.  Left Ear: External ear normal.  Nose: Nose normal.  Mouth/Throat: Oropharynx is clear and moist.  Eyes: Pupils are equal, round, and reactive to light. Conjunctivae are normal.  Neck: Normal range of motion. Neck supple. No JVD present. No tracheal deviation present. No thyromegaly present.  Cardiovascular: Normal rate, regular rhythm, normal heart sounds and intact distal pulses. Exam reveals no gallop and no friction rub.  No murmur heard. Pulmonary/Chest: Effort normal and breath sounds normal. No respiratory distress.  Abdominal: Soft. Bowel sounds are normal. He exhibits no distension. There is no hepatosplenomegaly. There is no tenderness.  Genitourinary:  Genitourinary Comments: Genital and Rectal Exam chaperoned by Donnie Mesa, CMA Rectal/DRE: Normal external exam without hemorrhoids fissures or abnormality. DRE with palpation of non-enlarged prostate smooth symmetrical without nodule or tenderness.  Musculoskeletal: Normal range of motion.  Lymphadenopathy:    He has no cervical adenopathy.  Neurological: He is alert and oriented to person, place, and time. No  cranial nerve deficit.  Skin: Skin is warm and dry. Capillary refill takes less than 2 seconds.  Psychiatric: He has a normal mood and affect. His behavior is normal. Judgment and thought content normal.  Nursing note and vitals reviewed.     Assessment & Plan:   Problem List Items Addressed This Visit    None    Visit Diagnoses    Encounter for annual physical exam    -  Primary   Relevant Orders   Hemoglobin A1c   HIV antibody   Lipid panel   TSH   PSA   CBC with Differential/Platelet   COMPLETE METABOLIC PANEL WITH GFR   Need for diphtheria-tetanus-pertussis (Tdap) vaccine       Relevant Orders   Tdap vaccine greater than or equal to 7yo IM (Completed)   Encounter to establish care        Previous PCP was many years ago.  Records will be reviewed for [facility] orthopedics in [place].  Past medical, family, and  surgical history reviewed w/ patient in clinic today.  Physical exam with no new findings.  Well adult with no acute concerns.  Plan: 1. Obtain health maintenance screenings as above according to age. - Fasting labs in next 7 days, include PSA for prostate cancer screening. - Increase physical activity to 30 minutes most days of the week.  - Eat healthy diet high in vegetables and fruits; low in refined carbohydrates. 2. Return 1 year for annual physical.      Follow up plan: Return in about 1 year (around 02/24/2019) for annual physical.  Cassell Smiles, DNP, AGPCNP-BC Adult Gerontology Primary Care Nurse Practitioner [facility] 02/23/2018, 3:42 PM

## 2018-02-23 NOTE — Patient Instructions (Addendum)
Leonard Henderson,   Thank you for coming in to clinic today.  1. You will be due for FASTING BLOOD WORK.  This means you should eat no food or drink after midnight.  Drink only water or coffee without cream/sugar on the morning of your lab visit. - Please go ahead and schedule a "Lab Only" visit in the morning at the clinic for lab draw in the next 7 days. - Your results will be available about 2-3 days after blood draw.  If you have set up a MyChart account, you can can log in to MyChart online to view your results and a brief explanation. Also, we can discuss your results together at your next office visit if you would like.   2. Continue eating a healthy diet and staying active.  Please schedule a follow-up appointment with Cassell Smiles, AGNP. Return in about 1 year (around 02/24/2019) for annual physical.  If you have any other questions or concerns, please feel free to call the clinic or send a message through [place]. You may also schedule an earlier appointment if necessary.  You will receive a survey after today's visit either digitally by e-mail or paper by [employer]. Your experiences and feedback matter to [place].  Please respond so we know how we are doing as we provide care for you.   Cassell Smiles, DNP, AGNP-BC Adult Gerontology Nurse Practitioner [facility]

## 2018-02-25 ENCOUNTER — Other Ambulatory Visit: Payer: PRIVATE HEALTH INSURANCE

## 2018-02-26 LAB — LIPID PANEL
Cholesterol: 188 mg/dL (ref <200)
HDL: 45 mg/dL (ref >40)
LDL Cholesterol (Calc): 125 mg/dL (calc) — ABNORMAL HIGH
Non-HDL Cholesterol (Calc): 143 mg/dL (calc) — ABNORMAL HIGH (ref <130)
Total CHOL/HDL Ratio: 4.2 (calc) (ref <5.0)
Triglycerides: 79 mg/dL (ref <150)

## 2018-02-26 LAB — COMPLETE METABOLIC PANEL WITH GFR
AG Ratio: 1.9 (calc) (ref 1.0–2.5)
ALT: 16 U/L (ref 9–46)
AST: 17 U/L (ref 10–40)
Albumin: 4.1 g/dL (ref 3.6–5.1)
Alkaline phosphatase (APISO): 48 U/L (ref 40–115)
BUN: 17 mg/dL (ref 7–25)
CO2: 28 mmol/L (ref 20–32)
Calcium: 9.2 mg/dL (ref 8.6–10.3)
Chloride: 105 mmol/L (ref 98–110)
Creat: 1.17 mg/dL (ref 0.60–1.35)
GFR, Est African American: 84 mL/min/{1.73_m2} (ref >=60)
GFR, Est Non African American: 73 mL/min/{1.73_m2} (ref >=60)
Globulin: 2.2 g/dL (calc) (ref 1.9–3.7)
Glucose, Bld: 86 mg/dL (ref 65–99)
Potassium: 4.3 mmol/L (ref 3.5–5.3)
Sodium: 139 mmol/L (ref 135–146)
Total Bilirubin: 0.4 mg/dL (ref 0.2–1.2)
Total Protein: 6.3 g/dL (ref 6.1–8.1)

## 2018-02-26 LAB — CBC WITH DIFFERENTIAL/PLATELET
Basophils Absolute: 9 cells/uL (ref 0–200)
Basophils Relative: 0.2 %
Eosinophils Absolute: 40 cells/uL (ref 15–500)
Eosinophils Relative: 0.9 %
HCT: 39 % (ref 38.5–50.0)
Hemoglobin: 13.4 g/dL (ref 13.2–17.1)
Lymphs Abs: 1716 cells/uL (ref 850–3900)
MCH: 30.2 pg (ref 27.0–33.0)
MCHC: 34.4 g/dL (ref 32.0–36.0)
MCV: 88 fL (ref 80.0–100.0)
MPV: 10.8 fL (ref 7.5–12.5)
Monocytes Relative: 13.3 %
Neutro Abs: 2050 cells/uL (ref 1500–7800)
Neutrophils Relative %: 46.6 %
Platelets: 256 10*3/uL (ref 140–400)
RBC: 4.43 10*6/uL (ref 4.20–5.80)
RDW: 12.4 % (ref 11.0–15.0)
Total Lymphocyte: 39 %
WBC mixed population: 585 cells/uL (ref 200–950)
WBC: 4.4 10*3/uL (ref 3.8–10.8)

## 2018-02-26 LAB — HEMOGLOBIN A1C
Hgb A1c MFr Bld: 5.7 % of total Hgb — ABNORMAL HIGH (ref <5.7)
Mean Plasma Glucose: 117 (calc)
eAG (mmol/L): 6.5 (calc)

## 2018-02-26 LAB — PSA: PSA: 1.2 ng/mL (ref <=4.0)

## 2018-02-26 LAB — HIV ANTIBODY (ROUTINE TESTING W REFLEX): HIV 1&2 Ab, 4th Generation: NONREACTIVE

## 2018-02-26 LAB — TSH: TSH: 0.7 mIU/L (ref 0.40–4.50)

## 2018-03-29 ENCOUNTER — Other Ambulatory Visit: Payer: Self-pay

## 2018-03-29 ENCOUNTER — Ambulatory Visit
Admission: EM | Admit: 2018-03-29 | Discharge: 2018-03-29 | Disposition: A | Discharge status: Home / Self Care | Payer: Self-pay | Attending: Family Medicine | Admitting: Family Medicine

## 2018-03-29 ENCOUNTER — Ambulatory Visit (INDEPENDENT_AMBULATORY_CARE_PROVIDER_SITE_OTHER): Payer: Self-pay

## 2018-03-29 DIAGNOSIS — R197 Diarrhea, unspecified: Secondary | ICD-10-CM

## 2018-03-29 DIAGNOSIS — R1033 Periumbilical pain: Secondary | ICD-10-CM

## 2018-03-29 LAB — CBC WITH DIFFERENTIAL/PLATELET
Basophils Absolute: 0.1 10*3/uL (ref 0–0.1)
Basophils Relative: 1 %
Eosinophils Absolute: 0 10*3/uL (ref 0–0.7)
Eosinophils Relative: 1 %
HCT: 40.8 % (ref 40.0–52.0)
Hemoglobin: 13.2 g/dL (ref 13.0–18.0)
Lymphocytes Relative: 45 %
Lymphs Abs: 2.1 10*3/uL (ref 1.0–3.6)
MCH: 29.4 pg (ref 26.0–34.0)
MCHC: 32.4 g/dL (ref 32.0–36.0)
MCV: 90.6 fL (ref 80.0–100.0)
Monocytes Absolute: 0.5 10*3/uL (ref 0.2–1.0)
Monocytes Relative: 11 %
Neutro Abs: 2 10*3/uL (ref 1.4–6.5)
Neutrophils Relative %: 42 %
Platelets: 202 10*3/uL (ref 150–440)
RBC: 4.51 MIL/uL (ref 4.40–5.90)
RDW: 13.2 % (ref 11.5–14.5)
WBC: 4.7 10*3/uL (ref 3.8–10.6)

## 2018-03-29 LAB — COMPREHENSIVE METABOLIC PANEL
ALT: 18 U/L (ref 0–44)
AST: 19 U/L (ref 15–41)
Albumin: 4.2 g/dL (ref 3.5–5.0)
Alkaline Phosphatase: 56 U/L (ref 38–126)
Anion gap: 8 (ref 5–15)
BUN: 16 mg/dL (ref 6–20)
CO2: 27 mmol/L (ref 22–32)
Calcium: 9 mg/dL (ref 8.9–10.3)
Chloride: 103 mmol/L (ref 98–111)
Creatinine, Ser: 1.2 mg/dL (ref 0.61–1.24)
GFR calc Af Amer: >60 mL/min (ref >60)
GFR calc non Af Amer: >60 mL/min (ref >60)
Glucose, Bld: 108 mg/dL — ABNORMAL HIGH (ref 70–99)
Potassium: 4.3 mmol/L (ref 3.5–5.1)
Sodium: 138 mmol/L (ref 135–145)
Total Bilirubin: 0.5 mg/dL (ref 0.3–1.2)
Total Protein: 7.4 g/dL (ref 6.5–8.1)

## 2018-03-29 LAB — C DIFFICILE QUICK SCREEN W PCR REFLEX
C Diff antigen: NEGATIVE
C Diff interpretation: NOT DETECTED
C Diff toxin: NEGATIVE

## 2018-03-29 IMAGING — CR DG ABDOMEN 2V
4 series · 4 of 4 positions shown · non-contrast
Comparison: None.

CLINICAL DATA: Diarrhea over the last week.  Abdominal pain.

EXAM:
ABDOMEN - 2 VIEW

[abdomen erect (1 of 3)]
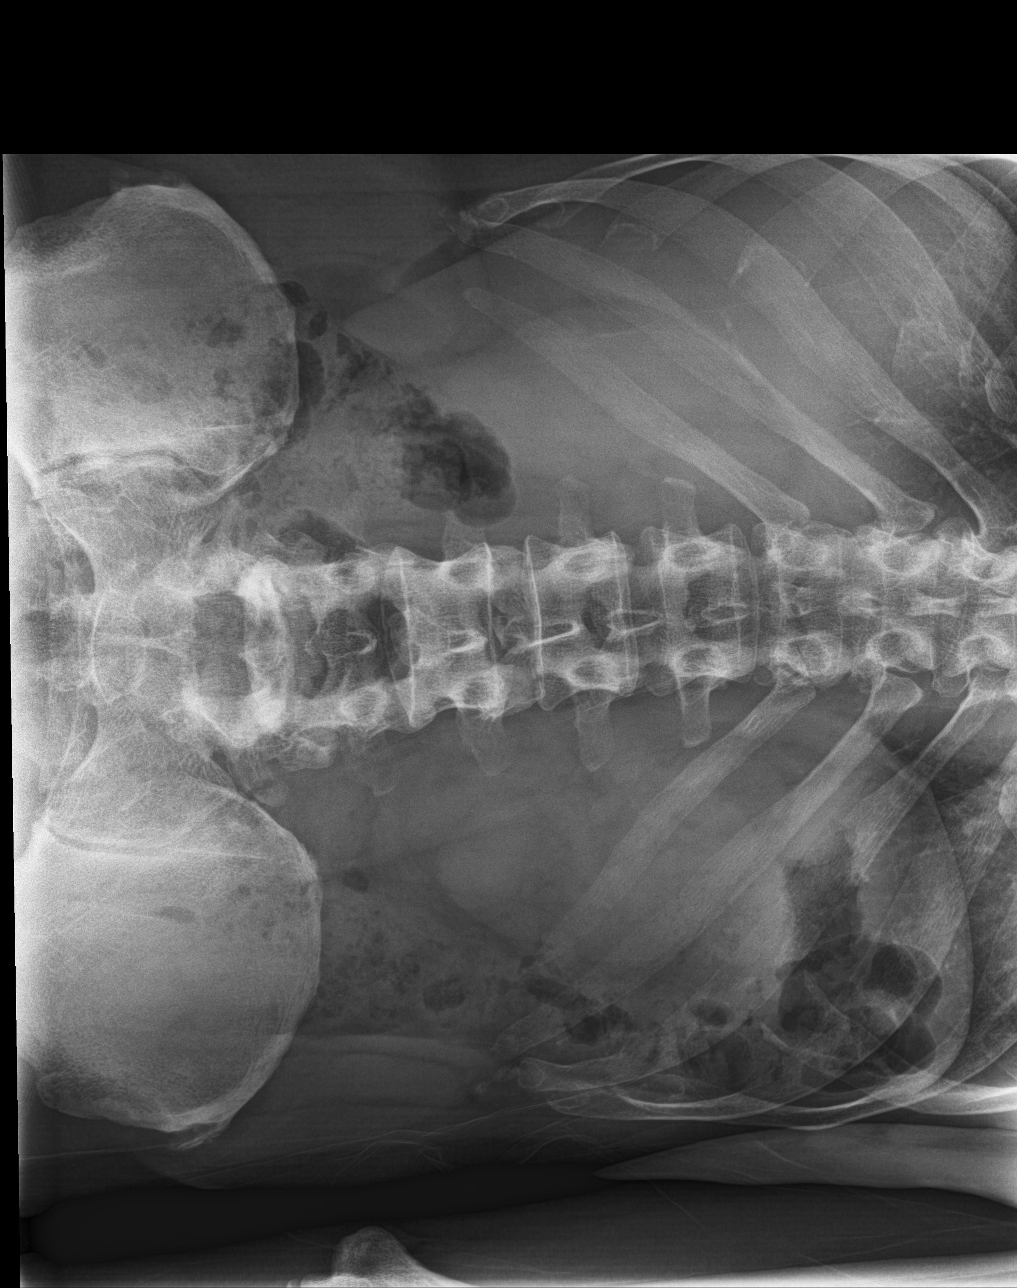

[abdomen supine]
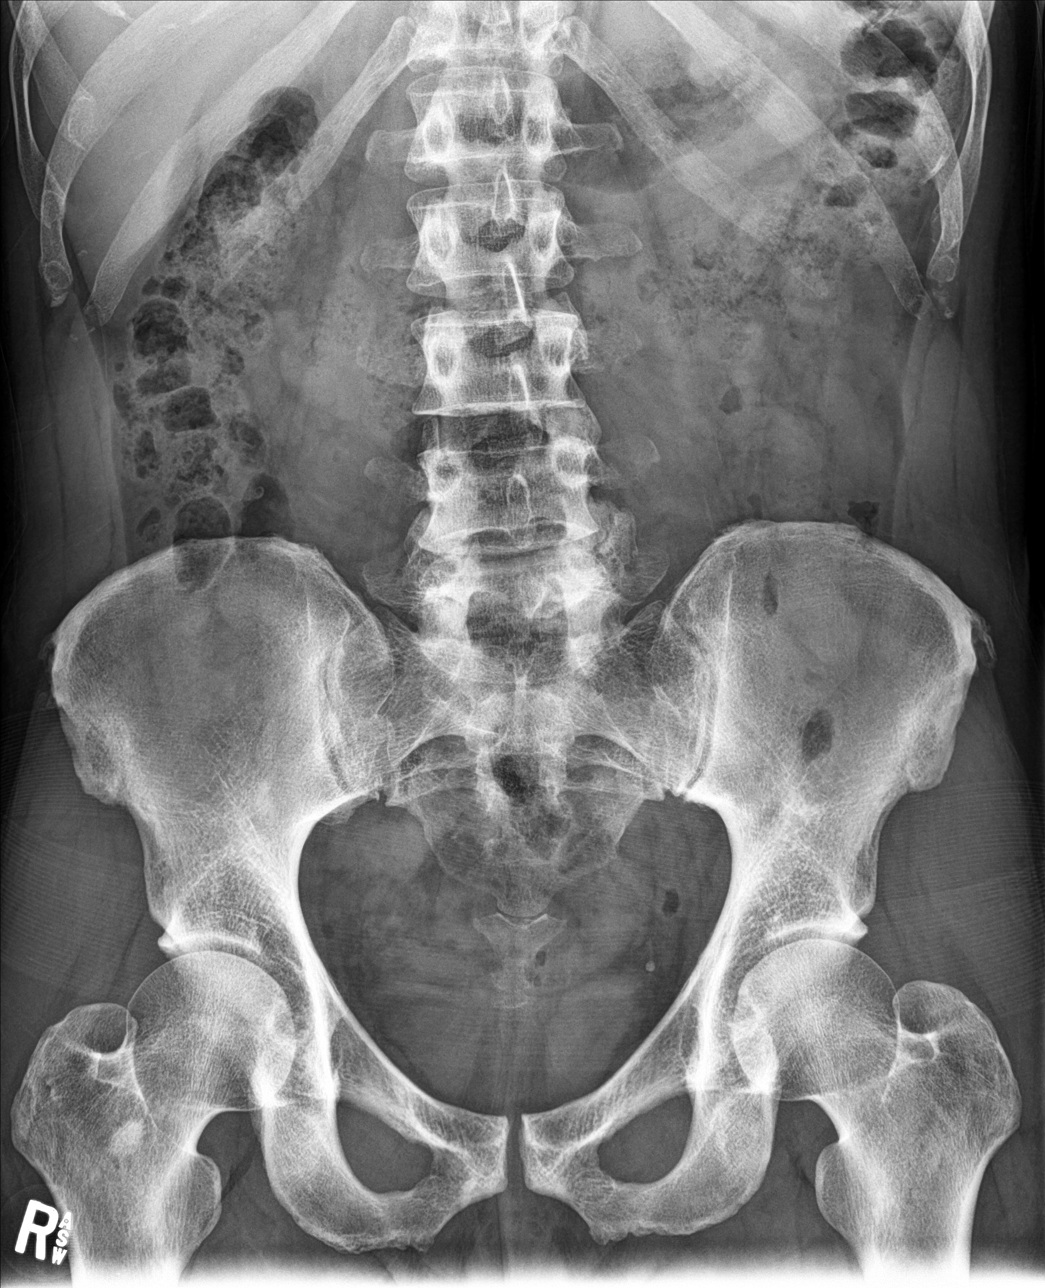

[abdomen erect (2 of 3)]
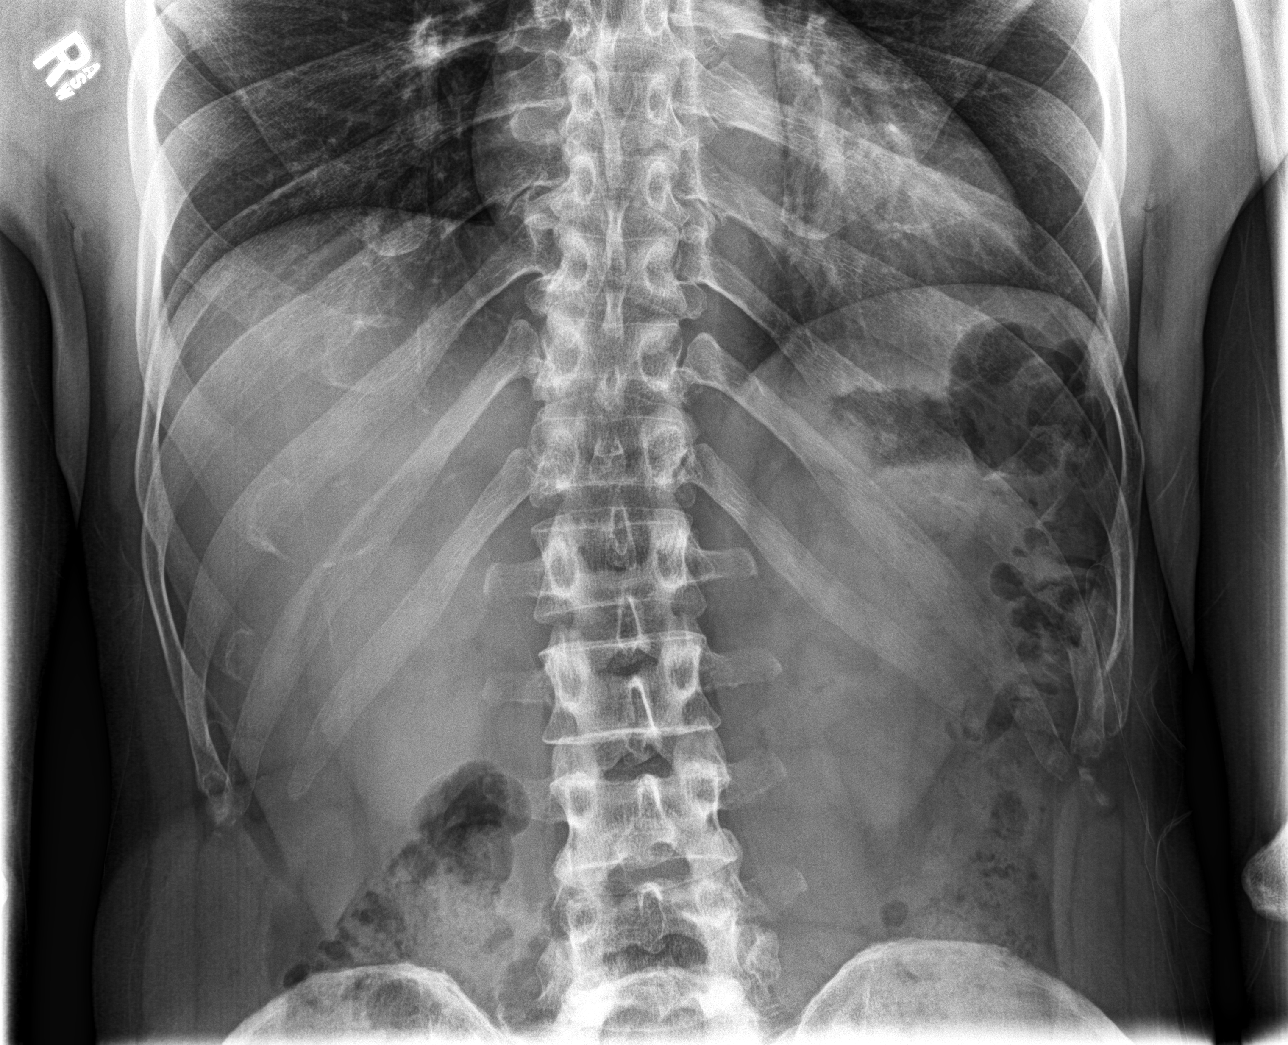

[abdomen erect (3 of 3)]
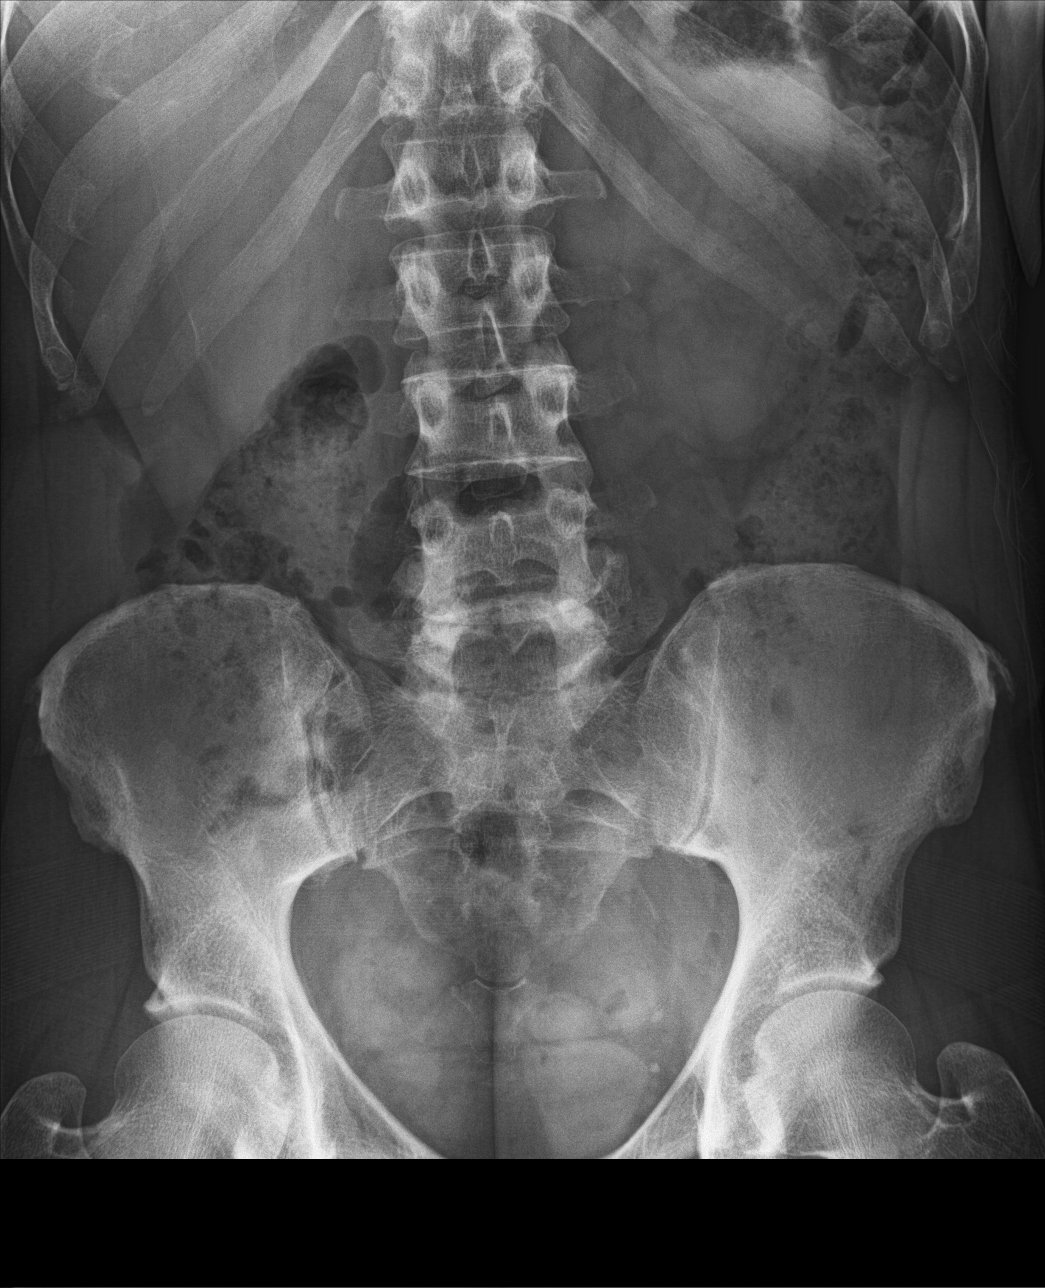

[4 of 4 positions shown; findings below may reference images not displayed]

FINDINGS: The bowel gas pattern is normal. There is no evidence of free air.
No radio-opaque calculi or other significant radiographic
abnormality is seen. Ordinary lower lumbar degenerative changes.
IMPRESSION: Normal radiographs. Bowel gas pattern is normal. No evidence of
air-fluid levels. No sign of obstruction.

## 2018-03-29 MED ORDER — LOPERAMIDE HCL 2 MG PO CAPS: 2.0000 mg | ORAL_CAPSULE | Freq: Four times a day (QID) | ORAL | 0 refills | Status: DC | PRN

## 2018-03-29 NOTE — ED Triage Notes (Signed)
Patient complains of diarrhea that has been off and on x 1 week. Estimates around 3 times daily. Patient states that he has noticed darker stools. Patient states that when this occurs he has noticed increase in abdominal pain.

## 2018-03-29 NOTE — ED Provider Notes (Signed)
[facility]    CSN: [id number] Arrival date & time: 03/29/18  1341     History   Chief Complaint Chief Complaint  Patient presents with  . Diarrhea    HPI Leonard Henderson is a 47 y.o. male.   HPI  47 year old male presents with diarrhea has had on and off for over a week.  Having between 3 and 4 loose stools daily. At First was very watery but today has started to have some form to it.  With the diarrhea he has had abdominal pain mostly periumbilical that he states is a 7./10.  Increase before the bowel movement and then decrease afterwards.  Stools are dark but he does admit to using Pepto-Bismol for relief.  He said he ate corn pie stores began to have his symptoms.  Denies any nausea or vomiting.  He said no fever has had chills.  Does not relate any other symptoms.        History reviewed. No pertinent past medical history.  There are no active problems to display for this patient.   Past Surgical History:  Procedure Laterality Date  . TONSILLECTOMY         Home Medications    Prior to Admission medications   Medication Sig Start Date End Date Taking? Authorizing Provider  loperamide (IMODIUM) 2 MG capsule Take 1 capsule (2 mg total) by mouth 4 (four) times daily as needed for diarrhea or loose stools. 03/29/18   Lorin Picket, PA-C    Family History Family History  Problem Relation Age of Onset  . Hypertension Mother   . Prostate cancer Father 51  . Hypertension Brother   . Breast cancer Sister 80    Social History Social History   Tobacco Use  . Smoking status: Never Smoker  . Smokeless tobacco: Never Used  Substance Use Topics  . Alcohol use: Yes    Alcohol/week: 1.0 standard drinks    Types: 1 Standard drinks or equivalent per week    Comment: occasional alcohol sometimes none in one month, sometimes up to 4 beverages.  . Drug use: Yes    Types: Marijuana    Comment: 2x per month over last 10 years     Allergies     Patient has no known allergies.   Review of Systems Review of Systems  Constitutional: Positive for activity change. Negative for appetite change, chills, fatigue and fever.  Gastrointestinal: Positive for abdominal pain and diarrhea. Negative for abdominal distention, anal bleeding, blood in stool, constipation, nausea, rectal pain and vomiting.  All other systems reviewed and are negative.    Physical Exam Triage Vital Signs ED Triage Vitals  Enc Vitals Group     BP 03/29/18 1355 (!) 123/92     Pulse Rate 03/29/18 1355 65     Resp 03/29/18 1355 16     Temp 03/29/18 1355 98.3 F (36.8 C)     Temp Source 03/29/18 1355 Oral     SpO2 03/29/18 1355 99 %     Weight 03/29/18 1354 152 lb (68.9 kg)     Height 03/29/18 1354 5\' 11"  (1.803 m)     Head Circumference --      Peak Flow --      Pain Score 03/29/18 1353 7     Pain Loc --      Pain Edu? --      Excl. in [place]? --    No data found.  Updated Vital Signs BP (!) 123/92 (  BP Location: Left Arm)   Pulse 65   Temp 98.3 F (36.8 C) (Oral)   Resp 16   Ht 5\' 11"  (1.803 m)   Wt 152 lb (68.9 kg)   SpO2 99%   BMI 21.20 kg/m   Visual Acuity Right Eye Distance:   Left Eye Distance:   Bilateral Distance:    Right Eye Near:   Left Eye Near:    Bilateral Near:     Physical Exam  Constitutional: He is oriented to person, place, and time. He appears well-developed and well-nourished. No distress.  HENT:  Head: Normocephalic.  Eyes: Pupils are equal, round, and reactive to light.  Neck: Normal range of motion.  Pulmonary/Chest: Effort normal and breath sounds normal.  Abdominal: Soft. Bowel sounds are normal. He exhibits no distension. There is no tenderness. There is no rebound and no guarding. No hernia.  Musculoskeletal: Normal range of motion.  Neurological: He is alert and oriented to person, place, and time.  Skin: Skin is warm and dry. He is not diaphoretic.  Psychiatric: He has a normal mood and affect. His behavior  is normal. Judgment and thought content normal.  Nursing note and vitals reviewed.    UC Treatments / Results  Labs (all labs ordered are listed, but only abnormal results are displayed) Labs Reviewed  COMPREHENSIVE METABOLIC PANEL - Abnormal; Notable for the following components:      Result Value   Glucose, Bld 108 (*)    All other components within normal limits  C DIFFICILE QUICK SCREEN W PCR REFLEX  GASTROINTESTINAL PANEL BY PCR, STOOL (REPLACES STOOL CULTURE)  CBC WITH DIFFERENTIAL/PLATELET    EKG None  Radiology Dg Abd 2 Views  Result Date: 03/29/2018 CLINICAL DATA:  Diarrhea over the last week.  Abdominal pain. EXAM: ABDOMEN - 2 VIEW COMPARISON:  None. FINDINGS: The bowel gas pattern is normal. There is no evidence of free air. No radio-opaque calculi or other significant radiographic abnormality is seen. Ordinary lower lumbar degenerative changes. IMPRESSION: Normal radiographs. Bowel gas pattern is normal. No evidence of air-fluid levels. No sign of obstruction. Electronically Signed   By: Nelson Chimes M.D.   On: 03/29/2018 15:22    Procedures Procedures (including critical care time)  Medications Ordered in UC Medications - No data to display  Initial Impression / Assessment and Plan / UC Course  I have reviewed the triage vital signs and the nursing notes.  Pertinent labs & imaging results that were available during my care of the patient were reviewed by me and considered in my medical decision making (see chart for details).     Plan: 1. Test/x-ray results and diagnosis reviewed with patient 2. rx as per orders; risks, benefits, potential side effects reviewed with patient 3. Recommend supportive treatment with continued hydration.  I have provided him information on  foods to help relieve diarrhea.  It is likely he is coming to the end of his illness since he is starting to have form to his diarrhea.  He was advised that if he worsens he should go to the  emergency room.  GI panel by PCR will be available most likely tomorrow.  Appropriate treatment will be considered at that time.  Provide him a prescription for Imodium he may choose to use. 4. F/u prn if symptoms worsen or don't improve  Final Clinical Impressions(s) / UC Diagnoses   Final diagnoses:  Diarrhea of presumed infectious origin   Discharge Instructions   None  ED Prescriptions    Medication Sig Dispense Auth. Provider   loperamide (IMODIUM) 2 MG capsule Take 1 capsule (2 mg total) by mouth 4 (four) times daily as needed for diarrhea or loose stools. 12 capsule Lorin Picket, PA-C     Controlled Substance Prescriptions [place] Controlled Substance Registry consulted? Not Applicable   Lorin Picket, PA-C 03/29/18 1552

## 2018-03-30 ENCOUNTER — Telehealth: Payer: Self-pay | Admitting: Emergency Medicine

## 2018-03-30 LAB — GASTROINTESTINAL PANEL BY PCR, STOOL (REPLACES STOOL CULTURE)
Adenovirus F40/41: NOT DETECTED
Astrovirus: NOT DETECTED
Campylobacter species: NOT DETECTED
Cryptosporidium: NOT DETECTED
Cyclospora cayetanensis: NOT DETECTED
Entamoeba histolytica: NOT DETECTED
Enteroaggregative E coli (EAEC): DETECTED — AB
Enteropathogenic E coli (EPEC): DETECTED — AB
Enterotoxigenic E coli (ETEC): NOT DETECTED
Giardia lamblia: NOT DETECTED
Norovirus GI/GII: NOT DETECTED
Plesimonas shigelloides: NOT DETECTED
Rotavirus A: NOT DETECTED
Salmonella species: NOT DETECTED
Sapovirus (I, II, IV, and V): NOT DETECTED
Shiga like toxin producing E coli (STEC): NOT DETECTED
Shigella/Enteroinvasive E coli (EIEC): NOT DETECTED
Vibrio cholerae: NOT DETECTED
Vibrio species: NOT DETECTED
Yersinia enterocolitica: NOT DETECTED

## 2018-03-30 NOTE — Telephone Encounter (Signed)
Per Marylene Land, NP prescription for Azithromycin 500mg  once daily for 3 days, #3 no refills sent to the pharmacy on file. Patient notified.

## 2018-03-30 NOTE — Telephone Encounter (Signed)
[place] lab called with results of [place]. Results are + for EAEC and + for EPEC. Notified Dr. Zenda Alpers of results.

## 2019-01-27 ENCOUNTER — Other Ambulatory Visit: Payer: Self-pay

## 2019-01-27 ENCOUNTER — Ambulatory Visit (INDEPENDENT_AMBULATORY_CARE_PROVIDER_SITE_OTHER): Payer: PRIVATE HEALTH INSURANCE | Admitting: Family Medicine

## 2019-01-27 ENCOUNTER — Encounter: Payer: Self-pay | Admitting: Family Medicine

## 2019-01-27 VITALS — BP 112/78 | HR 70 | Ht 71.0 in | Wt 153.0 lb

## 2019-01-27 DIAGNOSIS — L237 Allergic contact dermatitis due to plants, except food: Secondary | ICD-10-CM | POA: Diagnosis not present

## 2019-01-27 MED ORDER — PREDNISONE 20 MG PO TABS: ORAL_TABLET | ORAL | 0 refills | Status: DC

## 2019-01-27 NOTE — Progress Notes (Signed)
Subjective:    Patient ID: Leonard Henderson, male    DOB: 1971-04-02, 48 y.o.   MRN: [id number]  Leonard Henderson is a 48 y.o. male presenting on 01/27/2019 for Poison Ivy (hands, arms and face x 3 days. THe pt was working in the yard pulling vines and burning them. )  PCP is Cassell Smiles, AGPCNP-BC - I am currently covering during her maternity leave.   HPI   POISON IVY Reports new problem, exposed to poison ivy, 3 days ago, itchy rash developed on arms and spread to face, similar to last flare, he tried topical steroid and otc itch medicine limited relief, in past has taken prednisone for back pain before, last poison ivy lasted for >1-2 weeks Denies any fever chills sweats, drainage of pus   Depression screen PHQ 2/9 02/23/2018  Decreased Interest 0  Down, Depressed, Hopeless 0  PHQ - 2 Score 0    Social History   Tobacco Use  . Smoking status: Never Smoker  . Smokeless tobacco: Never Used  Substance Use Topics  . Alcohol use: Yes    Alcohol/week: 1.0 standard drinks    Types: 1 Standard drinks or equivalent per week    Comment: occasional alcohol sometimes none in one month, sometimes up to 4 beverages.  . Drug use: Yes    Types: Marijuana    Comment: 2x per month over last 10 years    Review of Systems Per HPI unless specifically indicated above     Objective:    BP 112/78 (BP Location: Left Arm, Patient Position: Sitting, Cuff Size: Normal)   Pulse 70   Ht 5\' 11"  (1.803 m)   Wt 153 lb (69.4 kg)   BMI 21.34 kg/m   Wt Readings from Last 3 Encounters:  01/27/19 153 lb (69.4 kg)  03/29/18 152 lb (68.9 kg)  02/23/18 152 lb 6.4 oz (69.1 kg)    Physical Exam Vitals signs and nursing note reviewed.  Constitutional:      General: He is not in acute distress.    Appearance: He is well-developed. He is not diaphoretic.     Comments: Well-appearing, comfortable, cooperative  HENT:     Head: Normocephalic and atraumatic.  Eyes:     General:        Right eye: No  discharge.        Left eye: No discharge.     Conjunctiva/sclera: Conjunctivae normal.  Cardiovascular:     Rate and Rhythm: Normal rate.  Pulmonary:     Effort: Pulmonary effort is normal.  Skin:    General: Skin is warm and dry.     Findings: Rash (linear patchy excoriated raised rash on fore arm inner aspect R side, and face nasal and L arm) present. No erythema.  Neurological:     Mental Status: He is alert and oriented to person, place, and time.  Psychiatric:        Behavior: Behavior normal.     Comments: Well groomed, good eye contact, normal speech and thoughts        Assessment & Plan:   Problem List Items Addressed This Visit    None    Visit Diagnoses    Contact dermatitis due to poison ivy    -  Primary   Relevant Medications   predniSONE (DELTASONE) 20 MG tablet      Consistent with poison ivy contact dermatitis, x 3 day duration. Patient with known h/o poison ivy, similar with previous exposures - Afebrile, no  evidence of bacterial superinfection from scratching  Plan: 1. Prednisone 20mg  tabs - 7 day taper course 2 Start OTC Benadryl PRN itching, may try topical calamine lotion, topical steroid 3. Avoid scratching 4. Return criteria given, 1-2 weeks if worsening or not improving - may offer steroid injection if prefer next week if only partial response vs can extend oral prednisone up to 1 week if need   Meds ordered this encounter  Medications  . predniSONE (DELTASONE) 20 MG tablet    Sig: Take daily with food. Start with 60mg  (3 pills) x 2 days, then reduce to 40mg  (2 pills) x 3 days, then 20mg  (1 pill) x 2 days    Dispense:  14 tablet    Refill:  0     Follow up plan: Return in about 1 week (around 02/03/2019), or if symptoms worsen or fail to improve, for poison ivy.   Nobie Putnam, [facility] 01/27/2019, 1:45 PM

## 2019-01-27 NOTE — Patient Instructions (Addendum)
Thank you for coming to the office today.  It looks like you do have [place] / [place] Recommend to use topical treatments with Calamine, (may try oatmeal bath), and may take Benadryl at night as needed for itching and sleep.  Start Prednisone (antiinflammatory steroid) - take 3 tablets (60mg ) daily with breakfast for 2 days, then take 2 tablet (40mg ) for 3 days, then take 1 tablet (20mg ) for 2 days. Complete entire course, even if feeling better.  Call sooner if you need shot or if you need more medicine may take longer  If symptoms get worse or do not resolve at all within 2 weeks, please return for re-evaluation. Otherwise, if improved but start to come back, you may call for refill on Prednisone - if develop swelling, redness, fever, and warmth, drainage of pus concern for infection please return sooner. - Try your best to avoid scratching (reduce chances of infection)  In the future, to help prevent poison ivy skin rash it is recommended to thoroughly wash potentially exposed areas with soap or even liquid dish soap (if it is hand safe) within 30 minutes of initial exposure. You can reduce risk of flare up if you wash within 1 to 3 hours, but if you wait more than 3 hours, high likelihood of developing the rash.   Please schedule a Follow-up Appointment to: Return in about 1 week (around 02/03/2019), or if symptoms worsen or fail to improve, for poison ivy.  If you have any other questions or concerns, please feel free to call the office or send a message through [place]. You may also schedule an earlier appointment if necessary.  Additionally, you may be receiving a survey about your experience at our office within a few days to 1 week by e-mail or mail. We value your feedback.  Nobie Putnam, DO [facility]

## 2019-03-01 ENCOUNTER — Encounter: Payer: Self-pay | Admitting: Nurse Practitioner

## 2019-10-05 ENCOUNTER — Other Ambulatory Visit (HOSPITAL_COMMUNITY)
Admission: RE | Admit: 2019-10-05 | Discharge: 2019-10-05 | Disposition: A | Discharge status: Home / Self Care | Payer: 59 | Source: Ambulatory Visit | Attending: Family Medicine | Admitting: Family Medicine

## 2019-10-05 ENCOUNTER — Ambulatory Visit (INDEPENDENT_AMBULATORY_CARE_PROVIDER_SITE_OTHER): Payer: PRIVATE HEALTH INSURANCE | Admitting: Family Medicine

## 2019-10-05 ENCOUNTER — Encounter: Payer: Self-pay | Admitting: Family Medicine

## 2019-10-05 ENCOUNTER — Other Ambulatory Visit: Payer: Self-pay

## 2019-10-05 VITALS — BP 119/78 | HR 82 | Temp 97.5°F | Ht 71.0 in | Wt 154.6 lb

## 2019-10-05 DIAGNOSIS — R7303 Prediabetes: Secondary | ICD-10-CM | POA: Diagnosis not present

## 2019-10-05 DIAGNOSIS — Z7251 High risk heterosexual behavior: Secondary | ICD-10-CM

## 2019-10-05 DIAGNOSIS — Z0000 Encounter for general adult medical examination without abnormal findings: Secondary | ICD-10-CM | POA: Diagnosis not present

## 2019-10-05 DIAGNOSIS — Z1211 Encounter for screening for malignant neoplasm of colon: Secondary | ICD-10-CM

## 2019-10-05 DIAGNOSIS — G8929 Other chronic pain: Secondary | ICD-10-CM | POA: Insufficient documentation

## 2019-10-05 NOTE — Assessment & Plan Note (Signed)
Doing well, weight, diet, and exercise plan in place and maintaining healthy BMI.  Sleeping approximately 8 hours per night.  Last labs drawn 02/25/2018 and will repeat today.  Plan: 1) Labs ordered and drawn today 2) Continue current lifestyle habits with regards to diet and exercise 3) A referral has been placed for Colonoscopy, please schedule and complete this 4) We will plan to see back in clinic in 1 year, sooner should the need arise.

## 2019-10-05 NOTE — Patient Instructions (Signed)
We will have your labs drawn today and will contact you with the results  Continue your diet and exercise routine.  I have put in a referral for colonoscopy with gastroenterology.  They will contact you to schedule.  If you do not hear anything from them in approximately 2 weeks to contact our office and we will reach out to the referral office.  We will plan to see you back in 12 months for your next physical, sooner should the need arise.  You will receive a survey after today's visit either digitally by e-mail or paper by [employer]. Your experiences and feedback matter to [place].  Please respond so we know how we are doing as we provide care for you.  Call us with any questions/concerns/needs.  It is my goal to be available to you for your health concerns.  Thanks for choosing me to be a partner in your healthcare needs!  Harlin Rain, FNP-C Family Nurse Practitioner [facility] Phone: [phone]

## 2019-10-05 NOTE — Progress Notes (Signed)
Subjective:    Patient ID: Leonard Henderson, male    DOB: 02/13/1971, 49 y.o.   MRN: [id number]  Leonard Henderson is a 49 y.o. male presenting on 10/05/2019 for Annual Exam   HPI  HEALTH MAINTENANCE:  Weight/BMI: Good weight Physical activity: Regular exercise Diet: Regular Seatbelt: 100% of the time Sunscreen: Regularly in the summer Prostate exam/PSA: Will draw PSA today Colon cancer screening: Ordered colonoscopy today  HIV Screening: Completed 02/23/2018 GC/CT (<66 years old yearly) Optometry: Approx 2 years ago Dentistry: Every 6 months  IMMUNIZATIONS: Influenza: Tetanus: 02/23/2018 COVID:   Depression screen [facility] 2/9 10/05/2019 02/23/2018  Decreased Interest 0 0  Down, Depressed, Hopeless 0 0  PHQ - 2 Score 0 0    History reviewed. No pertinent past medical history. Past Surgical History:  Procedure Laterality Date  . TONSILLECTOMY     Social History   Socioeconomic History  . Marital status: Married    Spouse name: Not on file  . Number of children: 3  . Years of education: Not on file  . Highest education level: High school graduate  Occupational History  . Occupation: cabinets    Comment: [employer]  Tobacco Use  . Smoking status: Never Smoker  . Smokeless tobacco: Never Used  Substance and Sexual Activity  . Alcohol use: Yes    Alcohol/week: 1.0 standard drinks    Types: 1 Standard drinks or equivalent per week    Comment: occasional alcohol sometimes none in one month, sometimes up to 4 beverages.  . Drug use: Not Currently    Comment: 2x per month over last 10 years  . Sexual activity: Yes    Birth control/protection: None  Other Topics Concern  . Not on file  Social History Narrative  . Not on file   Social Determinants of Health   Financial Resource Strain:   . Difficulty of Paying Living Expenses: Not on file  Food Insecurity:   . Worried About Charity fundraiser in the Last Year: Not on file  . Ran Out of Food in the Last Year: Not on  file  Transportation Needs:   . Lack of Transportation (Medical): Not on file  . Lack of Transportation (Non-Medical): Not on file  Physical Activity:   . Days of Exercise per Week: Not on file  . Minutes of Exercise per Session: Not on file  Stress:   . Feeling of Stress : Not on file  Social Connections:   . Frequency of Communication with Friends and Family: Not on file  . Frequency of Social Gatherings with Friends and Family: Not on file  . Attends Religious Services: Not on file  . Active Member of Clubs or Organizations: Not on file  . Attends Archivist Meetings: Not on file  . Marital Status: Not on file  Intimate Partner Violence:   . Fear of Current or Ex-Partner: Not on file  . Emotionally Abused: Not on file  . Physically Abused: Not on file  . Sexually Abused: Not on file   Family History  Problem Relation Age of Onset  . Hypertension Mother   . Prostate cancer Father 27  . Hypertension Brother   . Breast cancer Sister 38   Current Outpatient Medications on File Prior to Visit  Medication Sig  . Cod Liver Oil 1000 MG CAPS Take by mouth.  . Multiple Vitamin (MULTIVITAMIN) tablet Take 1 tablet by mouth daily.   No current facility-administered medications on file prior to visit.  Per HPI unless specifically indicated above     Objective:    BP 119/78 (BP Location: Right Arm, Patient Position: Sitting, Cuff Size: Normal)   Pulse 82   Temp (!) 97.5 F (36.4 C) (Oral)   Ht 5\' 11"  (1.803 m)   Wt 154 lb 9.6 oz (70.1 kg)   BMI 21.56 kg/m   Wt Readings from Last 3 Encounters:  10/05/19 154 lb 9.6 oz (70.1 kg)  01/27/19 153 lb (69.4 kg)  03/29/18 152 lb (68.9 kg)    Physical Exam Vitals reviewed.  Constitutional:      General: He is not in acute distress.    Appearance: Normal appearance. He is well-groomed and normal weight. He is not ill-appearing or toxic-appearing.  HENT:     Head: Normocephalic.     Right Ear: Tympanic membrane, ear  canal and external ear normal. There is no impacted cerumen.     Left Ear: Tympanic membrane, ear canal and external ear normal. There is impacted cerumen.     Nose: Nose normal. No congestion or rhinorrhea.     Mouth/Throat:     Mouth: Mucous membranes are moist.     Pharynx: Oropharynx is clear. No oropharyngeal exudate or posterior oropharyngeal erythema.  Eyes:     General: Lids are normal. Vision grossly intact. No scleral icterus.       Right eye: No discharge or hordeolum.        Left eye: No discharge or hordeolum.     Extraocular Movements: Extraocular movements intact.     Conjunctiva/sclera: Conjunctivae normal.     Pupils: Pupils are equal, round, and reactive to light.  Neck:     Thyroid: No thyroid mass, thyromegaly or thyroid tenderness.  Cardiovascular:     Rate and Rhythm: Normal rate and regular rhythm.     Pulses: Normal pulses.          Dorsalis pedis pulses are 2+ on the right side and 2+ on the left side.       Posterior tibial pulses are 2+ on the right side and 2+ on the left side.     Heart sounds: Normal heart sounds. No murmur. No friction rub. No gallop.   Pulmonary:     Effort: Pulmonary effort is normal. No respiratory distress.     Breath sounds: Normal breath sounds.  Abdominal:     General: Abdomen is flat. Bowel sounds are normal. There is no distension or abdominal bruit.     Palpations: Abdomen is soft. There is no hepatomegaly, splenomegaly or mass.     Tenderness: There is no abdominal tenderness. There is no guarding or rebound.     Hernia: No hernia is present.  Musculoskeletal:        General: Normal range of motion.     Cervical back: Normal range of motion and neck supple. No tenderness.     Right lower leg: No edema.     Left lower leg: No edema.  Feet:     Right foot:     Skin integrity: Skin integrity normal.     Left foot:     Skin integrity: Skin integrity normal.  Lymphadenopathy:     Cervical: No cervical adenopathy.  Skin:     General: Skin is warm and dry.     Capillary Refill: Capillary refill takes less than 2 seconds.  Neurological:     General: No focal deficit present.     Mental Status: He is alert and oriented  to person, place, and time.     Cranial Nerves: Cranial nerves are intact.     Sensory: Sensation is intact.     Motor: Motor function is intact.     Coordination: Coordination is intact.     Gait: Gait is intact.     Deep Tendon Reflexes: Reflexes normal.  Psychiatric:        Attention and Perception: Attention and perception normal.        Mood and Affect: Mood and affect normal.        Speech: Speech normal.        Behavior: Behavior normal. Behavior is cooperative.        Thought Content: Thought content normal.        Cognition and Memory: Cognition and memory normal.        Judgment: Judgment normal.     Results for orders placed or performed during the hospital encounter of 03/29/18  C difficile quick scan w PCR reflex   Specimen: STOOL  Result Value Ref Range   C Diff antigen NEGATIVE NEGATIVE   C Diff toxin NEGATIVE NEGATIVE   C Diff interpretation No C. difficile detected.   Gastrointestinal Panel by PCR , Stool   Specimen: STOOL  Result Value Ref Range   Campylobacter species NOT DETECTED NOT DETECTED   Plesimonas shigelloides NOT DETECTED NOT DETECTED   Salmonella species NOT DETECTED NOT DETECTED   Yersinia enterocolitica NOT DETECTED NOT DETECTED   Vibrio species NOT DETECTED NOT DETECTED   Vibrio cholerae NOT DETECTED NOT DETECTED   Enteroaggregative E coli (EAEC) DETECTED (A) NOT DETECTED   Enteropathogenic E coli (EPEC) DETECTED (A) NOT DETECTED   Enterotoxigenic E coli (ETEC) NOT DETECTED NOT DETECTED   Shiga like toxin producing E coli (STEC) NOT DETECTED NOT DETECTED   Shigella/Enteroinvasive E coli (EIEC) NOT DETECTED NOT DETECTED   Cryptosporidium NOT DETECTED NOT DETECTED   Cyclospora cayetanensis NOT DETECTED NOT DETECTED   Entamoeba histolytica NOT  DETECTED NOT DETECTED   Giardia lamblia NOT DETECTED NOT DETECTED   Adenovirus F40/41 NOT DETECTED NOT DETECTED   Astrovirus NOT DETECTED NOT DETECTED   Norovirus GI/GII NOT DETECTED NOT DETECTED   Rotavirus A NOT DETECTED NOT DETECTED   Sapovirus (I, II, IV, and V) NOT DETECTED NOT DETECTED  CBC with Differential  Result Value Ref Range   WBC 4.7 3.8 - 10.6 K/uL   RBC 4.51 4.40 - 5.90 MIL/uL   Hemoglobin 13.2 13.0 - 18.0 g/dL   HCT 40.8 40.0 - 52.0 %   MCV 90.6 80.0 - 100.0 fL   MCH 29.4 26.0 - 34.0 pg   MCHC 32.4 32.0 - 36.0 g/dL   RDW 13.2 11.5 - 14.5 %   Platelets 202 150 - 440 K/uL   Neutrophils Relative % 42 %   Neutro Abs 2.0 1.4 - 6.5 K/uL   Lymphocytes Relative 45 %   Lymphs Abs 2.1 1.0 - 3.6 K/uL   Monocytes Relative 11 %   Monocytes Absolute 0.5 0.2 - 1.0 K/uL   Eosinophils Relative 1 %   Eosinophils Absolute 0.0 0 - 0.7 K/uL   Basophils Relative 1 %   Basophils Absolute 0.1 0 - 0.1 K/uL  Comprehensive metabolic panel  Result Value Ref Range   Sodium 138 135 - 145 mmol/L   Potassium 4.3 3.5 - 5.1 mmol/L   Chloride 103 98 - 111 mmol/L   CO2 27 22 - 32 mmol/L   Glucose, Bld 108 (H) 70 -  99 mg/dL   BUN 16 6 - 20 mg/dL   Creatinine, Ser 1.20 0.61 - 1.24 mg/dL   Calcium 9.0 8.9 - 10.3 mg/dL   Total Protein 7.4 6.5 - 8.1 g/dL   Albumin 4.2 3.5 - 5.0 g/dL   AST 19 15 - 41 U/L   ALT 18 0 - 44 U/L   Alkaline Phosphatase 56 38 - 126 U/L   Total Bilirubin 0.5 0.3 - 1.2 mg/dL   GFR calc non Af Amer >60 >60 mL/min   GFR calc Af Amer >60 >60 mL/min   Anion gap 8 5 - 15      Assessment & Plan:   Problem List Items Addressed This Visit      Other   Routine medical exam - Primary    Doing well, weight, diet, and exercise plan in place and maintaining healthy BMI.  Sleeping approximately 8 hours per night.  Last labs drawn 02/25/2018 and will repeat today.  Plan: 1) Labs ordered and drawn today 2) Continue current lifestyle habits with regards to diet and  exercise 3) A referral has been placed for Colonoscopy, please schedule and complete this 4) We will plan to see back in clinic in 1 year, sooner should the need arise.      Relevant Orders   CBC with Differential   COMPLETE METABOLIC PANEL WITH GFR   PSA   Prediabetes   Relevant Orders   CBC with Differential   COMPLETE METABOLIC PANEL WITH GFR   Lipid Profile   HgB A1c    Other Visit Diagnoses    High risk heterosexual behavior       Relevant Orders   HIV antibody (with reflex)   Hepatitis C Antibody   RPR   Chlamydia/Gonococcus/Trichomonas, NAA   GC/Chlamydia probe amp ([place])not at [facility]   Screening for malignant neoplasm of colon       Relevant Orders   Ambulatory referral to Gastroenterology      No orders of the defined types were placed in this encounter.     Follow up plan: Return in about 1 year (around 10/04/2020) for Physical.  Harlin Rain, FNP-C Family Nurse Practitioner [facility] 10/05/2019, 10:15 AM

## 2019-10-06 LAB — CBC WITH DIFFERENTIAL/PLATELET
Absolute Monocytes: 424 cells/uL (ref 200–950)
Basophils Absolute: 20 cells/uL (ref 0–200)
Basophils Relative: 0.5 %
Eosinophils Absolute: 32 cells/uL (ref 15–500)
Eosinophils Relative: 0.8 %
HCT: 43.2 % (ref 38.5–50.0)
Hemoglobin: 14.5 g/dL (ref 13.2–17.1)
Lymphs Abs: 1468 cells/uL (ref 850–3900)
MCH: 29.8 pg (ref 27.0–33.0)
MCHC: 33.6 g/dL (ref 32.0–36.0)
MCV: 88.9 fL (ref 80.0–100.0)
MPV: 11.3 fL (ref 7.5–12.5)
Monocytes Relative: 10.6 %
Neutro Abs: 2056 cells/uL (ref 1500–7800)
Neutrophils Relative %: 51.4 %
Platelets: 240 10*3/uL (ref 140–400)
RBC: 4.86 10*6/uL (ref 4.20–5.80)
RDW: 12.2 % (ref 11.0–15.0)
Total Lymphocyte: 36.7 %
WBC: 4 10*3/uL (ref 3.8–10.8)

## 2019-10-06 LAB — LIPID PANEL
Cholesterol: 240 mg/dL — ABNORMAL HIGH (ref <200)
HDL: 51 mg/dL (ref >=40)
LDL Cholesterol (Calc): 160 mg/dL (calc) — ABNORMAL HIGH
Non-HDL Cholesterol (Calc): 189 mg/dL (calc) — ABNORMAL HIGH (ref <130)
Total CHOL/HDL Ratio: 4.7 (calc) (ref <5.0)
Triglycerides: 157 mg/dL — ABNORMAL HIGH (ref <150)

## 2019-10-06 LAB — COMPLETE METABOLIC PANEL WITH GFR
AG Ratio: 2 (calc) (ref 1.0–2.5)
ALT: 17 U/L (ref 9–46)
AST: 17 U/L (ref 10–35)
Albumin: 4.4 g/dL (ref 3.6–5.1)
Alkaline phosphatase (APISO): 48 U/L (ref 35–144)
BUN: 10 mg/dL (ref 7–25)
CO2: 29 mmol/L (ref 20–32)
Calcium: 9.5 mg/dL (ref 8.6–10.3)
Chloride: 106 mmol/L (ref 98–110)
Creat: 0.93 mg/dL (ref 0.70–1.33)
GFR, Est African American: 111 mL/min/{1.73_m2} (ref >=60)
GFR, Est Non African American: 95 mL/min/{1.73_m2} (ref >=60)
Globulin: 2.2 g/dL (calc) (ref 1.9–3.7)
Glucose, Bld: 60 mg/dL — ABNORMAL LOW (ref 65–139)
Potassium: 4.4 mmol/L (ref 3.5–5.3)
Sodium: 140 mmol/L (ref 135–146)
Total Bilirubin: 0.5 mg/dL (ref 0.2–1.2)
Total Protein: 6.6 g/dL (ref 6.1–8.1)

## 2019-10-06 LAB — RPR: RPR Ser Ql: NONREACTIVE

## 2019-10-06 LAB — HEPATITIS C ANTIBODY
Hepatitis C Ab: NONREACTIVE
SIGNAL TO CUT-OFF: 0.06 (ref <1.00)

## 2019-10-06 LAB — HEMOGLOBIN A1C
Hgb A1c MFr Bld: 5.7 % of total Hgb — ABNORMAL HIGH (ref <5.7)
Mean Plasma Glucose: 117 (calc)
eAG (mmol/L): 6.5 (calc)

## 2019-10-06 LAB — PSA: PSA: 1.2 ng/mL (ref <=4.0)

## 2019-10-06 LAB — HIV ANTIBODY (ROUTINE TESTING W REFLEX): HIV 1&2 Ab, 4th Generation: NONREACTIVE

## 2019-10-09 ENCOUNTER — Telehealth: Payer: Self-pay | Admitting: Family Medicine

## 2019-10-09 LAB — GC/CHLAMYDIA PROBE AMP (~~LOC~~) NOT AT ARMC
Chlamydia: NEGATIVE
Comment: NEGATIVE
Comment: NORMAL
Neisseria Gonorrhea: NEGATIVE

## 2019-10-09 NOTE — Telephone Encounter (Signed)
Pt.is request his lab results

## 2019-10-09 NOTE — Progress Notes (Signed)
CBC and PSA are normal, lipids are elevated and we have two options.  He can work on lifestyle modifications such as heart healthy diet to help with lowering his cholesterol and increasing his exercise or we can begin a statin.  If he wants to work on lifestyle modifications, we can plan to repeat his labs in 6 months. GC/CT negative, HIV, Hepatitis C and RPR are negative.

## 2019-10-10 ENCOUNTER — Telehealth: Payer: Self-pay

## 2019-10-10 ENCOUNTER — Other Ambulatory Visit: Payer: Self-pay

## 2019-10-10 DIAGNOSIS — Z1211 Encounter for screening for malignant neoplasm of colon: Secondary | ICD-10-CM

## 2019-10-10 NOTE — Telephone Encounter (Signed)
Gastroenterology Pre-Procedure Review  Request Date: 11/06/19 Requesting Physician: Dr. Allen Norris  PATIENT REVIEW QUESTIONS: The patient responded to the following health history questions as indicated:    1. Are you having any GI issues? no 2. Do you have a personal history of Polyps? no 3. Do you have a family history of Colon Cancer or Polyps? no 4. Diabetes Mellitus? no 5. Joint replacements in the past 12 months?no 6. Major health problems in the past 3 months?no 7. Any artificial heart valves, MVP, or defibrillator?no    MEDICATIONS & ALLERGIES:    Patient reports the following regarding taking any anticoagulation/antiplatelet therapy:   Plavix, Coumadin, Eliquis, Xarelto, Lovenox, Pradaxa, Brilinta, or Effient? no Aspirin? no  Patient confirms/reports the following medications:  Current Outpatient Medications  Medication Sig Dispense Refill  . Cod Liver Oil 1000 MG CAPS Take by mouth.    . Multiple Vitamin (MULTIVITAMIN) tablet Take 1 tablet by mouth daily.     No current facility-administered medications for this visit.    Patient confirms/reports the following allergies:  No Known Allergies  No orders of the defined types were placed in this encounter.   AUTHORIZATION INFORMATION Primary Insurance: 1D#: Group #:  Secondary Insurance: 1D#: Group #:  SCHEDULE INFORMATION: Date: Monday 11/06/19 Time: Location:[facility]

## 2019-10-25 ENCOUNTER — Encounter: Payer: Self-pay | Admitting: Gastroenterology

## 2019-10-25 ENCOUNTER — Other Ambulatory Visit: Payer: Self-pay

## 2019-11-02 ENCOUNTER — Other Ambulatory Visit: Payer: Self-pay

## 2019-11-02 ENCOUNTER — Other Ambulatory Visit
Admission: RE | Admit: 2019-11-02 | Discharge: 2019-11-02 | Disposition: A | Discharge status: Home / Self Care | Payer: 59 | Source: Ambulatory Visit | Attending: Gastroenterology | Admitting: Gastroenterology

## 2019-11-02 DIAGNOSIS — Z01812 Encounter for preprocedural laboratory examination: Secondary | ICD-10-CM | POA: Insufficient documentation

## 2019-11-02 DIAGNOSIS — Z20822 Contact with and (suspected) exposure to covid-19: Secondary | ICD-10-CM | POA: Diagnosis not present

## 2019-11-02 NOTE — Discharge Instructions (Signed)
General Anesthesia, Adult, Care After This sheet gives you information about how to care for yourself after your procedure. Your health care provider may also give you more specific instructions. If you have problems or questions, contact your health care provider. What can I expect after the procedure? After the procedure, the following side effects are common:  Pain or discomfort at the IV site.  Nausea.  Vomiting.  Sore throat.  Trouble concentrating.  Feeling cold or chills.  Weak or tired.  Sleepiness and fatigue.  Soreness and body aches. These side effects can affect parts of the body that were not involved in surgery. Follow these instructions at home:  For at least 24 hours after the procedure:  Have a responsible adult stay with you. It is important to have someone help care for you until you are awake and alert.  Rest as needed.  Do not: ? Participate in activities in which you could fall or become injured. ? Drive. ? Use heavy machinery. ? Drink alcohol. ? Take sleeping pills or medicines that cause drowsiness. ? Make important decisions or sign legal documents. ? Take care of children on your own. Eating and drinking  Follow any instructions from your health care provider about eating or drinking restrictions.  When you feel hungry, start by eating small amounts of foods that are soft and easy to digest (bland), such as toast. Gradually return to your regular diet.  Drink enough fluid to keep your urine pale yellow.  If you vomit, rehydrate by drinking water, juice, or clear broth. General instructions  If you have sleep apnea, surgery and certain medicines can increase your risk for breathing problems. Follow instructions from your health care provider about wearing your sleep device: ? Anytime you are sleeping, including during daytime naps. ? While taking prescription pain medicines, sleeping medicines, or medicines that make you drowsy.  Return to  your normal activities as told by your health care provider. Ask your health care provider what activities are safe for you.  Take over-the-counter and prescription medicines only as told by your health care provider.  If you smoke, do not smoke without supervision.  Keep all follow-up visits as told by your health care provider. This is important. Contact a health care provider if:  You have nausea or vomiting that does not get better with medicine.  You cannot eat or drink without vomiting.  You have pain that does not get better with medicine.  You are unable to pass urine.  You develop a skin rash.  You have a fever.  You have redness around your IV site that gets worse. Get help right away if:  You have difficulty breathing.  You have chest pain.  You have blood in your urine or stool, or you vomit blood. Summary  After the procedure, it is common to have a sore throat or nausea. It is also common to feel tired.  Have a responsible adult stay with you for the first 24 hours after general anesthesia. It is important to have someone help care for you until you are awake and alert.  When you feel hungry, start by eating small amounts of foods that are soft and easy to digest (bland), such as toast. Gradually return to your regular diet.  Drink enough fluid to keep your urine pale yellow.  Return to your normal activities as told by your health care provider. Ask your health care provider what activities are safe for you. This information is not   intended to replace advice given to you by your health care provider. Make sure you discuss any questions you have with your health care provider. Document Revised: 07/23/2017 Document Reviewed: 03/05/2017 Elsevier Patient Education  2020 [employer]  

## 2019-11-03 LAB — SARS CORONAVIRUS 2 (TAT 6-24 HRS): SARS Coronavirus 2: NEGATIVE

## 2019-11-06 ENCOUNTER — Ambulatory Visit: Payer: 59 | Admitting: Anesthesiology

## 2019-11-06 ENCOUNTER — Encounter: Payer: Self-pay | Admitting: Gastroenterology

## 2019-11-06 ENCOUNTER — Other Ambulatory Visit: Payer: Self-pay

## 2019-11-06 ENCOUNTER — Encounter
Admission: RE | Disposition: A | Discharge status: Home / Self Care | Payer: Self-pay | Source: Home / Self Care | Attending: Gastroenterology

## 2019-11-06 ENCOUNTER — Ambulatory Visit
Admission: RE | Admit: 2019-11-06 | Discharge: 2019-11-06 | Disposition: A | Discharge status: Home / Self Care | Payer: 59 | Attending: Gastroenterology | Admitting: Gastroenterology

## 2019-11-06 DIAGNOSIS — K635 Polyp of colon: Secondary | ICD-10-CM | POA: Diagnosis not present

## 2019-11-06 DIAGNOSIS — Z1211 Encounter for screening for malignant neoplasm of colon: Secondary | ICD-10-CM | POA: Insufficient documentation

## 2019-11-06 DIAGNOSIS — K641 Second degree hemorrhoids: Secondary | ICD-10-CM | POA: Insufficient documentation

## 2019-11-06 DIAGNOSIS — D124 Benign neoplasm of descending colon: Secondary | ICD-10-CM | POA: Diagnosis not present

## 2019-11-06 HISTORY — DX: Other specified health status: Z78.9

## 2019-11-06 HISTORY — PX: COLONOSCOPY WITH PROPOFOL: SHX5780

## 2019-11-06 HISTORY — PX: POLYPECTOMY: SHX5525

## 2019-11-06 SURGERY — COLONOSCOPY WITH PROPOFOL
Anesthesia: General | Site: Rectum

## 2019-11-06 MED ORDER — ACETAMINOPHEN 325 MG PO TABS: 325.0000 mg | ORAL_TABLET | Freq: Once | ORAL | Status: DC

## 2019-11-06 MED ORDER — SODIUM CHLORIDE 0.9 % IV SOLN: INTRAVENOUS | Status: DC

## 2019-11-06 MED ORDER — LIDOCAINE HCL (CARDIAC) PF 100 MG/5ML IV SOSY
PREFILLED_SYRINGE | INTRAVENOUS | Status: DC | PRN
  Administered 2019-11-06: 50 mg via INTRAVENOUS

## 2019-11-06 MED ORDER — ACETAMINOPHEN 160 MG/5ML PO SOLN: 325.0000 mg | Freq: Once | ORAL | Status: DC

## 2019-11-06 MED ORDER — STERILE WATER FOR IRRIGATION IR SOLN
Status: DC | PRN
  Administered 2019-11-06: 50 mL

## 2019-11-06 MED ORDER — LACTATED RINGERS IV SOLN: INTRAVENOUS | Status: DC

## 2019-11-06 MED ORDER — PROPOFOL 10 MG/ML IV BOLUS
INTRAVENOUS | Status: DC | PRN
  Administered 2019-11-06 (×2): 20 mg via INTRAVENOUS
  Administered 2019-11-06: 100 mg via INTRAVENOUS
  Administered 2019-11-06: 50 mg via INTRAVENOUS
  Administered 2019-11-06 (×3): 20 mg via INTRAVENOUS

## 2019-11-06 SURGICAL SUPPLY — 6 items
CANISTER SUCT 1200ML W/VALVE (MISCELLANEOUS) ×2 IMPLANT
FORCEPS BIOP RAD 4 LRG CAP 4 (CUTTING FORCEPS) ×2 IMPLANT
GOWN CVR UNV OPN BCK APRN NK (MISCELLANEOUS) ×2 IMPLANT
GOWN ISOL THUMB LOOP REG UNIV (MISCELLANEOUS) ×2
KIT ENDO PROCEDURE OLY (KITS) ×2 IMPLANT
WATER STERILE IRR 250ML POUR (IV SOLUTION) ×2 IMPLANT

## 2019-11-06 NOTE — Anesthesia Preprocedure Evaluation (Signed)
Anesthesia Evaluation  Patient identified by MRN, date of birth, ID band Patient awake    Reviewed: Allergy & Precautions, H&P , NPO status , Patient's Chart, lab work & pertinent test results  Airway Mallampati: I  TM Distance: >3 FB Neck ROM: full    Dental no notable dental hx.    Pulmonary    Pulmonary exam normal breath sounds clear to auscultation       Cardiovascular Normal cardiovascular exam Rhythm:regular Rate:Normal     Neuro/Psych    GI/Hepatic   Endo/Other    Renal/GU      Musculoskeletal   Abdominal   Peds  Hematology   Anesthesia Other Findings   Reproductive/Obstetrics                             Anesthesia Physical Anesthesia Plan  ASA: II  Anesthesia Plan: General   Post-op Pain Management:    Induction: Intravenous  PONV Risk Score and Plan: 2 and Treatment may vary due to age or medical condition, TIVA and Propofol infusion  Airway Management Planned: Natural Airway  Additional Equipment:   Intra-op Plan:   Post-operative Plan:   Informed Consent: I have reviewed the patients History and Physical, chart, labs and discussed the procedure including the risks, benefits and alternatives for the proposed anesthesia with the patient or authorized representative who has indicated his/her understanding and acceptance.     Dental Advisory Given  Plan Discussed with: CRNA  Anesthesia Plan Comments:         Anesthesia Quick Evaluation

## 2019-11-06 NOTE — Anesthesia Procedure Notes (Signed)
Procedure Name: MAC Date/Time: 11/06/2019 7:55 AM Performed by: Vanetta Shawl, CRNA Pre-anesthesia Checklist: Patient identified, Emergency Drugs available, Suction available, Timeout performed and Patient being monitored Patient Re-evaluated:Patient Re-evaluated prior to induction Oxygen Delivery Method: Nasal cannula Placement Confirmation: positive ETCO2

## 2019-11-06 NOTE — Transfer of Care (Signed)
Immediate Anesthesia Transfer of Care Note  Patient: Leonard Henderson  Procedure(s) Performed: COLONOSCOPY WITH BIOPSY (N/A Rectum) POLYPECTOMY (N/A Rectum)  Patient Location: PACU  Anesthesia Type: General  Level of Consciousness: awake, alert  and patient cooperative  Airway and Oxygen Therapy: Patient Spontanous Breathing and Patient connected to supplemental oxygen  Post-op Assessment: Post-op Vital signs reviewed, Patient's Cardiovascular Status Stable, Respiratory Function Stable, Patent Airway and No signs of Nausea or vomiting  Post-op Vital Signs: Reviewed and stable  Complications: No apparent anesthesia complications

## 2019-11-06 NOTE — H&P (Signed)
Lucilla Lame, MD [address] [place], [place] [address] Phone: [phone] Fax : [phone]  Primary Care Physician:  Verl Bangs, FNP Primary Gastroenterologist:  Dr. Allen Norris  Pre-Procedure History & Physical: HPI:  Leonard Henderson is a 49 y.o. male is here for a screening colonoscopy.   Past Medical History:  Diagnosis Date  . Medical history non-contributory     Past Surgical History:  Procedure Laterality Date  . TONSILLECTOMY    . WRIST SURGERY Right     Prior to Admission medications   Medication Sig Start Date End Date Taking? Authorizing Provider  Cod Liver Oil 1000 MG CAPS Take by mouth.   Yes [provider]  Multiple Vitamin (MULTIVITAMIN) tablet Take 1 tablet by mouth daily.   Yes [provider]    Allergies as of 10/10/2019  . (No Known Allergies)    Family History  Problem Relation Age of Onset  . Hypertension Mother   . Prostate cancer Father 24  . Hypertension Brother   . Breast cancer Sister 27    Social History   Socioeconomic History  . Marital status: Married    Spouse name: Not on file  . Number of children: 3  . Years of education: Not on file  . Highest education level: High school graduate  Occupational History  . Occupation: cabinets    Comment: [employer] agency  Tobacco Use  . Smoking status: Never Smoker  . Smokeless tobacco: Never Used  Substance and Sexual Activity  . Alcohol use: Yes    Alcohol/week: 6.0 standard drinks    Types: 6 Cans of beer per week    Comment: occasional alcohol sometimes none in one month, sometimes up to 4 beverages.  . Drug use: Not Currently    Types: Marijuana    Comment: 2x per month over last 10 years  . Sexual activity: Yes    Birth control/protection: None  Other Topics Concern  . Not on file  Social History Narrative  . Not on file   Social Determinants of Health   Financial Resource Strain:   . Difficulty of Paying Living Expenses:   Food  Insecurity:   . Worried About Charity fundraiser in the Last Year:   . Arboriculturist in the Last Year:   Transportation Needs:   . Film/video editor (Medical):   Marland Kitchen Lack of Transportation (Non-Medical):   Physical Activity:   . Days of Exercise per Week:   . Minutes of Exercise per Session:   Stress:   . Feeling of Stress :   Social Connections:   . Frequency of Communication with Friends and Family:   . Frequency of Social Gatherings with Friends and Family:   . Attends Religious Services:   . Active Member of Clubs or Organizations:   . Attends Archivist Meetings:   Marland Kitchen Marital Status:   Intimate Partner Violence:   . Fear of Current or Ex-Partner:   . Emotionally Abused:   Marland Kitchen Physically Abused:   . Sexually Abused:     Review of Systems: See HPI, otherwise negative ROS  Physical Exam: BP 119/82   Pulse (!) 54   Temp (!) 97.5 F (36.4 C) (Temporal)   Resp 16   Ht 5\' 11"  (1.803 m)   Wt 67.6 kg   SpO2 100%   BMI 20.78 kg/m  General:   Alert,  pleasant and cooperative in NAD Head:  Normocephalic and atraumatic. Neck:  Supple; no masses or thyromegaly. Lungs:  Clear throughout to auscultation.    Heart:  Regular rate and rhythm. Abdomen:  Soft, nontender and nondistended. Normal bowel sounds, without guarding, and without rebound.   Neurologic:  Alert and  oriented x4;  grossly normal neurologically.  Impression/Plan: Leonard Henderson is now here to undergo a screening colonoscopy.  Risks, benefits, and alternatives regarding colonoscopy have been reviewed with the patient.  Questions have been answered.  All parties agreeable.

## 2019-11-06 NOTE — Anesthesia Postprocedure Evaluation (Signed)
Anesthesia Post Note  Patient: Leonard Henderson  Procedure(s) Performed: COLONOSCOPY WITH BIOPSY (N/A Rectum) POLYPECTOMY (N/A Rectum)     Patient location during evaluation: PACU Anesthesia Type: General Level of consciousness: awake and alert and oriented Pain management: satisfactory to patient Vital Signs Assessment: post-procedure vital signs reviewed and stable Respiratory status: spontaneous breathing, nonlabored ventilation and respiratory function stable Cardiovascular status: blood pressure returned to baseline and stable Postop Assessment: Adequate PO intake and No signs of nausea or vomiting Anesthetic complications: no    Raliegh Ip

## 2019-11-06 NOTE — Op Note (Signed)
[facility] Gastroenterology Patient Name: Leonard Henderson Procedure Date: 11/06/2019 7:47 AM MRN: [id number] Account #: [id number] Date of Birth: 1970/12/28 Admit Type: Outpatient Age: 49 Room: [facility] OR ROOM 01 Gender: Male Note Status: Finalized Procedure:             Colonoscopy Indications:           Screening for colorectal malignant neoplasm Providers:             Lucilla Lame MD, MD Referring MD:          Olin Hauser (Referring MD) Medicines:             Propofol per Anesthesia Complications:         No immediate complications. Procedure:             Pre-Anesthesia Assessment:                        - Prior to the procedure, a History and Physical was                         performed, and patient medications and allergies were                         reviewed. The patient's tolerance of previous                         anesthesia was also reviewed. The risks and benefits                         of the procedure and the sedation options and risks                         were discussed with the patient. All questions were                         answered, and informed consent was obtained. Prior                         Anticoagulants: The patient has taken no previous                         anticoagulant or antiplatelet agents. ASA Grade                         Assessment: II - A patient with mild systemic disease.                         After reviewing the risks and benefits, the patient                         was deemed in satisfactory condition to undergo the                         procedure.                        After obtaining informed consent, the colonoscope was  passed under direct vision. Throughout the procedure,                         the patient's blood pressure, pulse, and oxygen                         saturations were monitored continuously. The                         Colonoscope was introduced through  the anus and                         advanced to the the cecum, identified by appendiceal                         orifice and ileocecal valve. The colonoscopy was                         performed without difficulty. The patient tolerated                         the procedure well. The quality of the bowel                         preparation was excellent. Findings:      The perianal and digital rectal examinations were normal.      A 3 mm polyp was found in the descending colon. The polyp was sessile.       The polyp was removed with a cold biopsy forceps. Resection and       retrieval were complete.      Non-bleeding internal hemorrhoids were found during retroflexion. The       hemorrhoids were Grade II (internal hemorrhoids that prolapse but reduce       spontaneously). Impression:            - One 3 mm polyp in the descending colon, removed with                         a cold biopsy forceps. Resected and retrieved.                        - Non-bleeding internal hemorrhoids. Recommendation:        - Discharge patient to home.                        - Resume previous diet.                        - Continue present medications.                        - Await pathology results.                        - Repeat colonoscopy in 5 years if polyp adenoma and                         10 years if hyperplastic Procedure Code(s):     --- Professional ---  45380, Colonoscopy, flexible; with biopsy, single or                         multiple Diagnosis Code(s):     --- Professional ---                        Z12.11, Encounter for screening for malignant neoplasm                         of colon                        K63.5, Polyp of colon CPT copyright 2019 American Medical Association. All rights reserved. The codes documented in this report are preliminary and upon coder review may  be revised to meet current compliance requirements. Lucilla Lame MD, MD 11/06/2019 8:14:41  AM This report has been signed electronically. Number of Addenda: 0 Note Initiated On: 11/06/2019 7:47 AM Scope Withdrawal Time: 0 hours 7 minutes 0 seconds  Total Procedure Duration: 0 hours 14 minutes 41 seconds  Estimated Blood Loss:  Estimated blood loss: none.      [facility]

## 2019-11-07 ENCOUNTER — Encounter: Payer: Self-pay | Admitting: *Deleted

## 2019-11-07 LAB — SURGICAL PATHOLOGY

## 2019-11-09 ENCOUNTER — Encounter: Payer: Self-pay | Admitting: Gastroenterology

## 2019-11-14 ENCOUNTER — Telehealth: Payer: Self-pay

## 2019-11-14 NOTE — Telephone Encounter (Signed)
Patient left voicemail. Would like to know the results from his recent procedure.

## 2019-11-14 NOTE — Telephone Encounter (Signed)
Patient called & l/m on v/m asking for a call to his results from a colonoscopy completed on 11-06-2019

## 2019-11-15 NOTE — Telephone Encounter (Signed)
Contacted pt and advised of colonoscopy results.

## 2020-01-11 ENCOUNTER — Ambulatory Visit (INDEPENDENT_AMBULATORY_CARE_PROVIDER_SITE_OTHER): Payer: PRIVATE HEALTH INSURANCE | Admitting: Family Medicine

## 2020-01-11 ENCOUNTER — Other Ambulatory Visit: Payer: Self-pay

## 2020-01-11 ENCOUNTER — Ambulatory Visit: Payer: PRIVATE HEALTH INSURANCE | Admitting: Family Medicine

## 2020-01-11 ENCOUNTER — Encounter: Payer: Self-pay | Admitting: Family Medicine

## 2020-01-11 VITALS — BP 109/67 | HR 56 | Temp 97.1°F | Ht 71.0 in | Wt 151.0 lb

## 2020-01-11 DIAGNOSIS — M62838 Other muscle spasm: Secondary | ICD-10-CM | POA: Diagnosis not present

## 2020-01-11 MED ORDER — NAPROXEN 500 MG PO TABS: 500.0000 mg | ORAL_TABLET | Freq: Two times a day (BID) | ORAL | 0 refills | Status: AC

## 2020-01-11 MED ORDER — CYCLOBENZAPRINE HCL 10 MG PO TABS: 10.0000 mg | ORAL_TABLET | Freq: Three times a day (TID) | ORAL | 0 refills | Status: DC | PRN

## 2020-01-11 NOTE — Assessment & Plan Note (Signed)
Bilateral trapezius spasms with muscle strain of bilateral shoulders and neck.  Likely related to overuse.  Patient reports has been doing much heavy lifting lately.  Denies any numbness, tingling, weakness, difficulty with ambulation, open/closing jars/cans, changes in bowel/bladder function, sleep disturbances.  Plan: 1. Cyclobenzaprine 5-10mg  TID PRN for neck pain/spasm 2. Naprosyn BID and acetaminophen according to AVS directions 3. To use warm heat/massage to bilateral trapezius and neck to help with stiff muscles and spasms 4. To return to clinic in 2-4 weeks for re-evaluation

## 2020-01-11 NOTE — Patient Instructions (Addendum)
1. For your Shoulder/trapezius pain - Overall it seems reassuring. I don't see any findings on exam that are concerning for serious head injury or neurological injury.  2. Start Flexeril 10mg  (muscle relaxant) - use half or whole tablet up to 3 times daily (may make you drowsy) (I have sent in a prescription for this)  3. Start Naproxen (Naprosyn) or OTC Aleve 500mg  twice daily (with food) for 5 to 7 days or may do Ibuprofen / Advil (400mg  every 6 to 8 hours) with food for 5 to 7 days (I have sent in a prescription for this)  DO NOT TAKE ANY OTHER NSAID (motrin, ibuprofen, advil, aleve, goody's bc powder while taking the naprosyn)  4. Increase Tylenol to 1000mg  up to 3 times daily for breakthrough pain for 3-5 days then only as needed   5. Use moist heat or heating pad on shoulders / neck, and have family member help with soft tissue massage as demonstrated  We will plan to see you back in 2-4 weeks for neck pain follow up  You will receive a survey after today's visit either digitally by e-mail or paper by [employer]. Your experiences and feedback matter to [place].  Please respond so we know how we are doing as we provide care for you.  Call us with any questions/concerns/needs.  It is my goal to be available to you for your health concerns.  Thanks for choosing me to be a partner in your healthcare needs!  Harlin Rain, FNP-C Family Nurse Practitioner [facility] Phone: [phone]

## 2020-01-11 NOTE — Progress Notes (Signed)
Subjective:    Patient ID: Leonard Henderson, male    DOB: [birth year], 49 y.o.   MRN: [id number]  Leonard Henderson is a 49 y.o. male presenting on 01/11/2020 for Shoulder Pain (pt complains of constant aching bilateral shoulder pain that radiates down to both elbows. Certain movement or lifting causes intermittent sharp pain. x 2 weeks. The pain seems worsen at bedtime. 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 )   HPI   Mr. Leonard Henderson presents to clinic for evaluation of bilateral shoulder pain that has been going on for > 2 weeks.  Reports worsening of symptoms with certain movements and lifting.  Pain increases throughout the day and finds he is more achy at night.  Believes he may have been doing too much at work with too little rest for his muscles.  Has a prescription for tizanidine left over at home that he has taken without relief.  Has not tried any other medications/treatments.  Denies numbness, tingling, weakness, change in bowel/bladder function, difficulty opening/closing jars/cans.  No traumatic injury, fall, pop or previous accident to either shoulder or neck.  Depression screen [facility] 2/9 10/05/2019 02/23/2018  Decreased Interest 0 0  Down, Depressed, Hopeless 0 0  PHQ - 2 Score 0 0    Social History   Tobacco Use  . Smoking status: Never Smoker  . Smokeless tobacco: Never Used  Vaping Use  . Vaping Use: Never used  Substance Use Topics  . Alcohol use: Yes    Alcohol/week: 6.0 standard drinks    Types: 6 Cans of beer per week    Comment: occasional alcohol sometimes none in one month, sometimes up to 4 beverages.  . Drug use: Not Currently    Types: Marijuana    Comment: 2x per month over last 10 years    Review of Systems  Constitutional: Negative.   HENT: Negative.   Eyes: Negative.   Respiratory: Negative.   Cardiovascular: Negative.   Gastrointestinal: Negative.   Endocrine: Negative.   Genitourinary:  Negative.   Musculoskeletal: Positive for arthralgias, myalgias and neck pain. Negative for back pain, gait problem, joint swelling and neck stiffness.  Skin: Negative.   Allergic/Immunologic: Negative.   Neurological: Negative.   Hematological: Negative.   Psychiatric/Behavioral: Negative.    Per HPI unless specifically indicated above     Objective:    BP 109/67 (BP Location: Left Arm, Patient Position: Sitting, Cuff Size: Normal)   Pulse (!) 56   Temp (!) 97.1 F (36.2 C) (Temporal)   Ht 5\' 11"  (1.803 m)   Wt 151 lb (68.5 kg)   BMI 21.06 kg/m   Wt Readings from Last 3 Encounters:  01/11/20 151 lb (68.5 kg)  11/06/19 149 lb (67.6 kg)  10/05/19 154 lb 9.6 oz (70.1 kg)    Physical Exam Vitals reviewed.  Constitutional:      General: He is not in acute distress.    Appearance: Normal appearance. He is well-developed, well-groomed and normal weight. He is not ill-appearing or toxic-appearing.  HENT:     Head: Normocephalic.     Nose:     Comments: Lizbeth Bark is in place, covering mouth and nose  Eyes:     General:        Right eye: No discharge.        Left eye: No discharge.     Extraocular Movements: Extraocular movements intact.  Conjunctiva/sclera: Conjunctivae normal.     Pupils: Pupils are equal, round, and reactive to light.  Cardiovascular:     Rate and Rhythm: Normal rate and regular rhythm.     Pulses: Normal pulses.     Heart sounds: Normal heart sounds. No murmur heard.  No friction rub. No gallop.   Pulmonary:     Effort: Pulmonary effort is normal. No respiratory distress.     Breath sounds: Normal breath sounds.  Musculoskeletal:        General: Tenderness present.     Right shoulder: Normal.     Left shoulder: Normal.     Right upper arm: Normal.     Left upper arm: Normal.     Right elbow: Normal.     Left elbow: Normal.     Cervical back: Spasms and tenderness present. No edema, erythema, rigidity, torticollis, bony tenderness or crepitus.  Decreased range of motion.     Thoracic back: Normal.     Lumbar back: Normal.       Back:     Right lower leg: No edema.     Left lower leg: No edema.     Comments: Multiple areas of spasm/tenderness to bilateral trapezius muscles.  Tight tension with light palpation and decreased ROM secondary to muscle pain.  Skin:    General: Skin is warm and dry.     Capillary Refill: Capillary refill takes less than 2 seconds.  Neurological:     General: No focal deficit present.     Mental Status: He is alert and oriented to person, place, and time.     GCS: GCS eye subscore is 4. GCS verbal subscore is 5. GCS motor subscore is 6.     Cranial Nerves: No cranial nerve deficit.     Sensory: No sensory deficit.     Motor: No weakness, tremor, atrophy or abnormal muscle tone.     Coordination: Coordination normal.     Gait: Gait normal.     Deep Tendon Reflexes: Reflexes normal.  Psychiatric:        Attention and Perception: Attention and perception normal.        Mood and Affect: Mood and affect normal.        Speech: Speech normal.        Behavior: Behavior normal. Behavior is cooperative.        Thought Content: Thought content normal.        Cognition and Memory: Cognition and memory normal.        Judgment: Judgment normal.    Results for orders placed or performed during the hospital encounter of 11/06/19  Surgical pathology  Result Value Ref Range   SURGICAL PATHOLOGY      SURGICAL PATHOLOGY CASE: [id number] PATIENT: Leonard Henderson Surgical Pathology Report     Specimen Submitted: A. Colon polyp, descending; cold bx  Clinical History: Screening colonoscopy. Colon polyp.    DIAGNOSIS: A. COLON POLYP, DESCENDING; COLD BIOPSY: - TUBULAR ADENOMA. - NEGATIVE FOR HIGH-GRADE DYSPLASIA AND MALIGNANCY.  GROSS DESCRIPTION: A. Labeled: Descending colon polyp x1 cold biopsy Received: In formalin Tissue fragment(s): 3 Size: 0.5 x 0.2 x 0.1 cm Description: Aggregate of  pink-tan tissue fragments Entirely submitted in 1 cassette.   Final Diagnosis performed by Allena Napoleon, MD.   Electronically signed 11/07/2019 9:19:28AM The electronic signature indicates that the named Attending Pathologist has evaluated the specimen Technical component performed at [facility], [address] [place], [place] [address] Lab: [phone] Dir: Rush Farmer, MD,  MMM  Professional component performed at [facility], [facility], [place], [place],  [address] Lab: [phone] Dir: Dellia Nims. Rubinas, MD       Assessment & Plan:   Problem List Items Addressed This Visit      Musculoskeletal and Integument   Trapezius muscle spasm - Primary    Bilateral trapezius spasms with muscle strain of bilateral shoulders and neck.  Likely related to overuse.  Patient reports has been doing much heavy lifting lately.  Denies any numbness, tingling, weakness, difficulty with ambulation, open/closing jars/cans, changes in bowel/bladder function, sleep disturbances.  Plan: 1. Cyclobenzaprine 5-10mg  TID PRN for neck pain/spasm 2. Naprosyn BID and acetaminophen according to AVS directions 3. To use warm heat/massage to bilateral trapezius and neck to help with stiff muscles and spasms 4. To return to clinic in 2-4 weeks for re-evaluation      Relevant Medications   naproxen (NAPROSYN) 500 MG tablet   cyclobenzaprine (FLEXERIL) 10 MG tablet      Meds ordered this encounter  Medications  . naproxen (NAPROSYN) 500 MG tablet    Sig: Take 1 tablet (500 mg total) by mouth 2 (two) times daily with a meal.    Dispense:  60 tablet    Refill:  0  . cyclobenzaprine (FLEXERIL) 10 MG tablet    Sig: Take 1 tablet (10 mg total) by mouth 3 (three) times daily as needed for muscle spasms.    Dispense:  60 tablet    Refill:  0      Follow up plan: Return in about 4 weeks (around 02/08/2020) for Neck pain follow up.   Harlin Rain, [place] Family Nurse  Practitioner [facility] 01/11/2020, 3:01 PM

## 2020-02-06 ENCOUNTER — Ambulatory Visit: Payer: PRIVATE HEALTH INSURANCE | Admitting: Family Medicine

## 2020-04-17 ENCOUNTER — Encounter: Payer: PRIVATE HEALTH INSURANCE | Admitting: Family Medicine

## 2020-07-03 ENCOUNTER — Ambulatory Visit
Admission: EM | Admit: 2020-07-03 | Discharge: 2020-07-03 | Disposition: A | Discharge status: Home / Self Care | Payer: 59 | Attending: Physician Assistant | Admitting: Physician Assistant

## 2020-07-03 ENCOUNTER — Other Ambulatory Visit: Payer: Self-pay

## 2020-07-03 DIAGNOSIS — M25562 Pain in left knee: Secondary | ICD-10-CM

## 2020-07-03 MED ORDER — DICLOFENAC SODIUM 50 MG PO TBEC: DELAYED_RELEASE_TABLET | ORAL | 0 refills | Status: AC

## 2020-07-03 NOTE — Discharge Instructions (Signed)
Your knee pain today is likely related to a flareup of underlying arthritis.  Where they provided knee brace to help with support and stability.  You may take the diclofenac as prescribed to help with inflammation and pain.  You can also take over-the-counter Tylenol as needed for more pain relief.  Ice and elevate the knee.  Try to stay active, but do not overdo it.  If you are not feeling better in the next week or 2 then consider following up with orthopedics.  See information below.  You may have a condition requiring you to follow up with Orthopedics so please call one of the following office for appointment:   [facility] [address] [place], [place] [address] Phone: [phone]  [facility] [address] [place], [place] [address] Phone: [phone]

## 2020-07-03 NOTE — ED Provider Notes (Signed)
[facility]    CSN: [id number] Arrival date & time: 07/03/20  1033      History   Chief Complaint Chief Complaint  Patient presents with  . Knee Pain    left    HPI Leonard Henderson is a 49 y.o. male presenting for onset of left nontraumatic knee pain yesterday.  He denies any injury at all.  He denies any swelling, bruising, redness.  No numbness, tingling. Patient states that he did have an issue with the same knee a couple of years ago and had to get corticosteroid joint injections.  He says he thinks he was told it was due to arthritis.  Patient states that he is very active for his job and has to stoop/squat a lot.  Patient says pain is worse when going from a sitting to standing position.  He also has some pain on weightbearing.  Admits to feeling weak, but has not given out.  He says he has not taken any medication for symptoms.  He says that he is just been "rubbing it."  He does not have any other complaints or concerns today.  HPI  Past Medical History:  Diagnosis Date  . Medical history non-contributory     Patient Active Problem List   Diagnosis Date Noted  . Trapezius muscle spasm 01/11/2020  . Encounter for screening colonoscopy   . Polyp of descending colon   . Chronic lower back pain 10/05/2019  . Routine medical exam 10/05/2019  . Prediabetes 10/05/2019    Past Surgical History:  Procedure Laterality Date  . COLONOSCOPY WITH PROPOFOL N/A 11/06/2019   Procedure: COLONOSCOPY WITH BIOPSY;  Surgeon: Lucilla Lame, MD;  Location: [place];  Service: Endoscopy;  Laterality: N/A;  priority 4  . POLYPECTOMY N/A 11/06/2019   Procedure: POLYPECTOMY;  Surgeon: Lucilla Lame, MD;  Location: [place];  Service: Endoscopy;  Laterality: N/A;  . TONSILLECTOMY    . WRIST SURGERY Right        Home Medications    Prior to Admission medications   Medication Sig Start Date End Date Taking? Authorizing Provider  Cod Liver Oil 1000 MG CAPS  Take by mouth.    [provider]  cyclobenzaprine (FLEXERIL) 10 MG tablet Take 1 tablet (10 mg total) by mouth 3 (three) times daily as needed for muscle spasms. 01/11/20   Malfi, Lupita Raider, FNP  diclofenac (VOLTAREN) 50 MG EC tablet Take 1 tablet by mouth every 8-12 hours as needed for knee pain. 07/03/20 07/17/20  Danton Clap, PA-C  Multiple Vitamin (MULTIVITAMIN) tablet Take 1 tablet by mouth daily.    [provider]  tiZANidine (ZANAFLEX) 4 MG tablet Take 4 mg by mouth 3 (three) times daily as needed. Patient not taking: Reported on 01/11/2020 12/08/19   [provider]    Family History Family History  Problem Relation Age of Onset  . Hypertension Mother   . Prostate cancer Father 39  . Hypertension Brother   . Breast cancer Sister 41    Social History Social History   Tobacco Use  . Smoking status: Never Smoker  . Smokeless tobacco: Never Used  Vaping Use  . Vaping Use: Never used  Substance Use Topics  . Alcohol use: Yes    Alcohol/week: 6.0 standard drinks    Types: 6 Cans of beer per week    Comment: occasional alcohol sometimes none in one month, sometimes up to 4 beverages.  . Drug use: Not Currently  Types: Marijuana    Comment: 2x per month over last 10 years     Allergies   Patient has no known allergies.   Review of Systems Review of Systems  Constitutional: Negative for fatigue and fever.  Musculoskeletal: Positive for arthralgias and gait problem. Negative for joint swelling.  Skin: Negative for color change, rash and wound.  Neurological: Negative for weakness and numbness.     Physical Exam Triage Vital Signs ED Triage Vitals [07/03/20 1043]  Enc Vitals Group     BP 116/86     Pulse Rate 78     Resp 18     Temp 98.6 F (37 C)     Temp Source Oral     SpO2 100 %     Weight 155 lb (70.3 kg)     Height 5\' 11"  (1.803 m)     Head Circumference      Peak Flow      Pain Score 4     Pain Loc      Pain Edu?       Excl. in [place]?    No data found.  Updated Vital Signs BP 116/86   Pulse 78   Temp 98.6 F (37 C) (Oral)   Resp 18   Ht 5\' 11"  (1.803 m)   Wt 155 lb (70.3 kg)   SpO2 100%   BMI 21.62 kg/m       Physical Exam Vitals and nursing note reviewed.  Constitutional:      General: He is not in acute distress.    Appearance: Normal appearance. He is well-developed. He is not ill-appearing or toxic-appearing.  HENT:     Head: Normocephalic and atraumatic.  Eyes:     General: No scleral icterus.    Conjunctiva/sclera: Conjunctivae normal.  Cardiovascular:     Rate and Rhythm: Normal rate.     Pulses: Normal pulses.  Pulmonary:     Effort: Pulmonary effort is normal. No respiratory distress.  Musculoskeletal:     Cervical back: Neck supple.     Left knee: No swelling, deformity, effusion, erythema or ecchymosis. Normal range of motion. Tenderness present over the medial joint line. Normal pulse.  Skin:    General: Skin is warm and dry.  Neurological:     General: No focal deficit present.     Mental Status: He is alert. Mental status is at baseline.     Motor: No weakness.     Gait: Gait abnormal.  Psychiatric:        Mood and Affect: Mood normal.        Behavior: Behavior normal.        Thought Content: Thought content normal.      UC Treatments / Results  Labs (all labs ordered are listed, but only abnormal results are displayed) Labs Reviewed - No data to display  EKG   Radiology No results found.  Procedures Procedures (including critical care time)  Medications Ordered in UC Medications - No data to display  Initial Impression / Assessment and Plan / UC Course  I have reviewed the triage vital signs and the nursing notes.  Pertinent labs & imaging results that were available during my care of the patient were reviewed by me and considered in my medical decision making (see chart for details).   49 year old male presenting for nontraumatic left knee pain  since yesterday.  He does admit to history of having to get corticosteroid injections into the knee.  He suspects  he has some arthritis in the knee.  Admits to increased activity at work recently with squatting and stooping more than normal.  On exam, he does have some medial joint line tenderness.  He does have full range of motion.  There is no swelling.  Advised patient he likely is having a acute flareup on chronic underlying arthritis.  Advised RICE.  I have sent diclofenac to use as needed for pain and inflammation.  He is also been provided with a supportive knee brace.  Advised if he is not getting better with this treatment in the next week or 2 then he should follow-up with orthopedics.   Final Clinical Impressions(s) / UC Diagnoses   Final diagnoses:  Acute pain of left knee     Discharge Instructions     Your knee pain today is likely related to a flareup of underlying arthritis.  Where they provided knee brace to help with support and stability.  You may take the diclofenac as prescribed to help with inflammation and pain.  You can also take over-the-counter Tylenol as needed for more pain relief.  Ice and elevate the knee.  Try to stay active, but do not overdo it.  If you are not feeling better in the next week or 2 then consider following up with orthopedics.  See information below.  You may have a condition requiring you to follow up with Orthopedics so please call one of the following office for appointment:   [facility] [address] [place], [place] [address] Phone: [phone]  [facility] [address], [place], [place] [address] Phone: [phone]     ED Prescriptions    Medication Sig Dispense Auth. Provider   diclofenac (VOLTAREN) 50 MG EC tablet Take 1 tablet by mouth every 8-12 hours as needed for knee pain. 30 tablet Gretta Cool     PDMP not reviewed this encounter.   Danton Clap, PA-C 07/03/20 1144

## 2020-07-03 NOTE — ED Triage Notes (Signed)
Pt reports having L knee pain that began yesterday. No known injury to knee. No swelling.

## 2020-08-18 ENCOUNTER — Emergency Department (HOSPITAL_COMMUNITY): Payer: BC Managed Care – PPO

## 2020-08-18 ENCOUNTER — Encounter (HOSPITAL_COMMUNITY): Payer: Self-pay | Admitting: Emergency Medicine

## 2020-08-18 ENCOUNTER — Inpatient Hospital Stay (HOSPITAL_COMMUNITY)
Admission: EM | Admit: 2020-08-18 | Discharge: 2020-08-20 | DRG: 605 | Disposition: A | Discharge status: Home / Self Care | Payer: BC Managed Care – PPO | Attending: Internal Medicine | Admitting: Internal Medicine

## 2020-08-18 DIAGNOSIS — Y907 Blood alcohol level of 200-239 mg/100 ml: Secondary | ICD-10-CM | POA: Diagnosis present

## 2020-08-18 DIAGNOSIS — R270 Ataxia, unspecified: Secondary | ICD-10-CM | POA: Diagnosis not present

## 2020-08-18 DIAGNOSIS — S069X1A Unspecified intracranial injury with loss of consciousness of 30 minutes or less, initial encounter: Secondary | ICD-10-CM

## 2020-08-18 DIAGNOSIS — Z23 Encounter for immunization: Secondary | ICD-10-CM

## 2020-08-18 DIAGNOSIS — S0990XA Unspecified injury of head, initial encounter: Secondary | ICD-10-CM

## 2020-08-18 DIAGNOSIS — S0003XA Contusion of scalp, initial encounter: Principal | ICD-10-CM | POA: Diagnosis present

## 2020-08-18 DIAGNOSIS — M6282 Rhabdomyolysis: Secondary | ICD-10-CM | POA: Diagnosis present

## 2020-08-18 DIAGNOSIS — R52 Pain, unspecified: Secondary | ICD-10-CM

## 2020-08-18 DIAGNOSIS — T796XXA Traumatic ischemia of muscle, initial encounter: Secondary | ICD-10-CM | POA: Diagnosis present

## 2020-08-18 DIAGNOSIS — T148XXA Other injury of unspecified body region, initial encounter: Secondary | ICD-10-CM

## 2020-08-18 DIAGNOSIS — N179 Acute kidney failure, unspecified: Secondary | ICD-10-CM | POA: Diagnosis present

## 2020-08-18 DIAGNOSIS — T1490XA Injury, unspecified, initial encounter: Secondary | ICD-10-CM

## 2020-08-18 DIAGNOSIS — F10920 Alcohol use, unspecified with intoxication, uncomplicated: Secondary | ICD-10-CM

## 2020-08-18 DIAGNOSIS — F10120 Alcohol abuse with intoxication, uncomplicated: Secondary | ICD-10-CM | POA: Diagnosis present

## 2020-08-18 DIAGNOSIS — Z20822 Contact with and (suspected) exposure to covid-19: Secondary | ICD-10-CM | POA: Diagnosis present

## 2020-08-18 LAB — COMPREHENSIVE METABOLIC PANEL
ALT: 19 U/L (ref 0–44)
AST: 23 U/L (ref 15–41)
Albumin: 3.9 g/dL (ref 3.5–5.0)
Alkaline Phosphatase: 49 U/L (ref 38–126)
Anion gap: 10 (ref 5–15)
BUN: 12 mg/dL (ref 6–20)
CO2: 23 mmol/L (ref 22–32)
Calcium: 8.9 mg/dL (ref 8.9–10.3)
Chloride: 107 mmol/L (ref 98–111)
Creatinine, Ser: 1.13 mg/dL (ref 0.61–1.24)
GFR, Estimated: >60 mL/min (ref >60)
Glucose, Bld: 147 mg/dL — ABNORMAL HIGH (ref 70–99)
Potassium: 3.5 mmol/L (ref 3.5–5.1)
Sodium: 140 mmol/L (ref 135–145)
Total Bilirubin: 0.7 mg/dL (ref 0.3–1.2)
Total Protein: 6.6 g/dL (ref 6.5–8.1)

## 2020-08-18 LAB — CBC
HCT: 42.1 % (ref 39.0–52.0)
Hemoglobin: 13.4 g/dL (ref 13.0–17.0)
MCH: 29.5 pg (ref 26.0–34.0)
MCHC: 31.8 g/dL (ref 30.0–36.0)
MCV: 92.7 fL (ref 80.0–100.0)
Platelets: 241 10*3/uL (ref 150–400)
RBC: 4.54 MIL/uL (ref 4.22–5.81)
RDW: 12.1 % (ref 11.5–15.5)
WBC: 6.1 10*3/uL (ref 4.0–10.5)
nRBC: 0 % (ref 0.0–0.2)

## 2020-08-18 LAB — URINALYSIS, ROUTINE W REFLEX MICROSCOPIC
Bacteria, UA: NONE SEEN
Bilirubin Urine: NEGATIVE
Glucose, UA: NEGATIVE mg/dL
Ketones, ur: NEGATIVE mg/dL
Leukocytes,Ua: NEGATIVE
Nitrite: NEGATIVE
Protein, ur: NEGATIVE mg/dL
Specific Gravity, Urine: 1.005 (ref 1.005–1.030)
pH: 6 (ref 5.0–8.0)

## 2020-08-18 LAB — HEMOGLOBIN AND HEMATOCRIT, BLOOD
HCT: 43.3 % (ref 39.0–52.0)
Hemoglobin: 13.8 g/dL (ref 13.0–17.0)

## 2020-08-18 LAB — I-STAT CHEM 8, ED
BUN: 14 mg/dL (ref 6–20)
Calcium, Ion: 1.13 mmol/L — ABNORMAL LOW (ref 1.15–1.40)
Chloride: 105 mmol/L (ref 98–111)
Creatinine, Ser: 1.5 mg/dL — ABNORMAL HIGH (ref 0.61–1.24)
Glucose, Bld: 143 mg/dL — ABNORMAL HIGH (ref 70–99)
HCT: 41 % (ref 39.0–52.0)
Hemoglobin: 13.9 g/dL (ref 13.0–17.0)
Potassium: 3.5 mmol/L (ref 3.5–5.1)
Sodium: 142 mmol/L (ref 135–145)
TCO2: 24 mmol/L (ref 22–32)

## 2020-08-18 LAB — PROTIME-INR
INR: 1 (ref 0.8–1.2)
Prothrombin Time: 12.8 seconds (ref 11.4–15.2)

## 2020-08-18 LAB — SAMPLE TO BLOOD BANK

## 2020-08-18 LAB — RAPID URINE DRUG SCREEN, HOSP PERFORMED
Amphetamines: NOT DETECTED
Barbiturates: NOT DETECTED
Benzodiazepines: NOT DETECTED
Cocaine: NOT DETECTED
Opiates: NOT DETECTED
Tetrahydrocannabinol: NOT DETECTED

## 2020-08-18 LAB — RESP PANEL BY RT-PCR (FLU A&B, COVID) ARPGX2
Influenza A by PCR: NEGATIVE
Influenza B by PCR: NEGATIVE
SARS Coronavirus 2 by RT PCR: NEGATIVE

## 2020-08-18 LAB — CBG MONITORING, ED
Glucose-Capillary: 137 mg/dL — ABNORMAL HIGH (ref 70–99)
Glucose-Capillary: 92 mg/dL (ref 70–99)

## 2020-08-18 LAB — CK: Total CK: 839 U/L — ABNORMAL HIGH (ref 49–397)

## 2020-08-18 LAB — ETHANOL: Alcohol, Ethyl (B): 211 mg/dL — ABNORMAL HIGH (ref <10)

## 2020-08-18 LAB — LACTIC ACID, PLASMA: Lactic Acid, Venous: 1.7 mmol/L (ref 0.5–1.9)
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
## 2020-08-18 MED ORDER — LACTATED RINGERS IV BOLUS
1000.0000 mL | Freq: Once | INTRAVENOUS | Status: AC
  Administered 2020-08-18: 1000 mL via INTRAVENOUS

## 2020-08-18 MED ORDER — IBUPROFEN 600 MG PO TABS: 600.0000 mg | ORAL_TABLET | Freq: Four times a day (QID) | ORAL | 0 refills | Status: DC | PRN

## 2020-08-18 MED ORDER — NALOXONE HCL 2 MG/2ML IJ SOSY
PREFILLED_SYRINGE | INTRAMUSCULAR | Status: AC | PRN
  Administered 2020-08-18: 2 mg via INTRAVENOUS

## 2020-08-18 MED ORDER — ACETAMINOPHEN 500 MG PO TABS
1000.0000 mg | ORAL_TABLET | Freq: Once | ORAL | Status: AC
  Administered 2020-08-18: 1000 mg via ORAL
  Filled 2020-08-18: qty 2

## 2020-08-18 MED ORDER — SODIUM CHLORIDE 0.9 % IV SOLN: INTRAVENOUS | Status: DC

## 2020-08-18 MED ORDER — SODIUM CHLORIDE 0.9 % IV SOLN
INTRAVENOUS | Status: AC | PRN
  Administered 2020-08-18: 500 mL via INTRAVENOUS

## 2020-08-18 MED ORDER — SODIUM CHLORIDE 0.9 % IV SOLN
INTRAVENOUS | Status: AC | PRN
  Administered 2020-08-18: 1000 mL via INTRAVENOUS

## 2020-08-18 NOTE — ED Notes (Signed)
Pt was assisted to wheelchair and wheeled to the restroom. Pt was assisted while standing when urinating. Pt was assisted back to wheelchair and wheeled to bed. Pt was assisted back in bed and blankets were provided.
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
## 2020-08-18 NOTE — ED Notes (Signed)
Patient transported to CT 
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
## 2020-08-18 NOTE — ED Notes (Signed)
Wife is updated on patient at this time.
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
## 2020-08-19 ENCOUNTER — Encounter (HOSPITAL_COMMUNITY): Payer: Self-pay | Admitting: Internal Medicine

## 2020-08-19 ENCOUNTER — Observation Stay (HOSPITAL_COMMUNITY): Payer: BC Managed Care – PPO

## 2020-08-19 ENCOUNTER — Other Ambulatory Visit: Payer: Self-pay

## 2020-08-19 DIAGNOSIS — R270 Ataxia, unspecified: Secondary | ICD-10-CM | POA: Diagnosis present

## 2020-08-19 DIAGNOSIS — N179 Acute kidney failure, unspecified: Secondary | ICD-10-CM | POA: Diagnosis present

## 2020-08-19 DIAGNOSIS — T796XXA Traumatic ischemia of muscle, initial encounter: Secondary | ICD-10-CM | POA: Diagnosis present

## 2020-08-19 DIAGNOSIS — Y907 Blood alcohol level of 200-239 mg/100 ml: Secondary | ICD-10-CM | POA: Diagnosis present

## 2020-08-19 DIAGNOSIS — Z23 Encounter for immunization: Secondary | ICD-10-CM | POA: Diagnosis not present

## 2020-08-19 DIAGNOSIS — M6282 Rhabdomyolysis: Secondary | ICD-10-CM | POA: Diagnosis present

## 2020-08-19 DIAGNOSIS — S0003XA Contusion of scalp, initial encounter: Secondary | ICD-10-CM | POA: Diagnosis present

## 2020-08-19 DIAGNOSIS — F10120 Alcohol abuse with intoxication, uncomplicated: Secondary | ICD-10-CM | POA: Diagnosis present

## 2020-08-19 DIAGNOSIS — Z20822 Contact with and (suspected) exposure to covid-19: Secondary | ICD-10-CM | POA: Diagnosis present

## 2020-08-19 DIAGNOSIS — S0990XA Unspecified injury of head, initial encounter: Secondary | ICD-10-CM

## 2020-08-19 LAB — CBC WITH DIFFERENTIAL/PLATELET
Abs Immature Granulocytes: 0.02 10*3/uL (ref 0.00–0.07)
Abs Immature Granulocytes: 0.02 10*3/uL (ref 0.00–0.07)
Basophils Absolute: 0 10*3/uL (ref 0.0–0.1)
Basophils Absolute: 0 10*3/uL (ref 0.0–0.1)
Basophils Relative: 0 %
Basophils Relative: 0 %
Eosinophils Absolute: 0 10*3/uL (ref 0.0–0.5)
Eosinophils Absolute: 0 10*3/uL (ref 0.0–0.5)
Eosinophils Relative: 0 %
Eosinophils Relative: 0 %
HCT: 38.5 % — ABNORMAL LOW (ref 39.0–52.0)
HCT: 41.3 % (ref 39.0–52.0)
Hemoglobin: 12.9 g/dL — ABNORMAL LOW (ref 13.0–17.0)
Hemoglobin: 13.1 g/dL (ref 13.0–17.0)
Immature Granulocytes: 0 %
Immature Granulocytes: 0 %
Lymphocytes Relative: 19 %
Lymphocytes Relative: 22 %
Lymphs Abs: 1.7 10*3/uL (ref 0.7–4.0)
Lymphs Abs: 1.8 10*3/uL (ref 0.7–4.0)
MCH: 29.1 pg (ref 26.0–34.0)
MCH: 30.8 pg (ref 26.0–34.0)
MCHC: 31.2 g/dL (ref 30.0–36.0)
MCHC: 34 g/dL (ref 30.0–36.0)
MCV: 90.4 fL (ref 80.0–100.0)
MCV: 93 fL (ref 80.0–100.0)
Monocytes Absolute: 0.8 10*3/uL (ref 0.1–1.0)
Monocytes Absolute: 1.1 10*3/uL — ABNORMAL HIGH (ref 0.1–1.0)
Monocytes Relative: 10 %
Monocytes Relative: 12 %
Neutro Abs: 5.4 10*3/uL (ref 1.7–7.7)
Neutro Abs: 6.2 10*3/uL (ref 1.7–7.7)
Neutrophils Relative %: 68 %
Neutrophils Relative %: 69 %
Platelets: 199 10*3/uL (ref 150–400)
Platelets: 213 10*3/uL (ref 150–400)
RBC: 4.26 MIL/uL (ref 4.22–5.81)
RBC: 4.44 MIL/uL (ref 4.22–5.81)
RDW: 12.1 % (ref 11.5–15.5)
RDW: 12.4 % (ref 11.5–15.5)
WBC: 8 10*3/uL (ref 4.0–10.5)
WBC: 9.1 10*3/uL (ref 4.0–10.5)
nRBC: 0 % (ref 0.0–0.2)
nRBC: 0 % (ref 0.0–0.2)

## 2020-08-19 LAB — COMPREHENSIVE METABOLIC PANEL
ALT: 22 U/L (ref 0–44)
AST: 35 U/L (ref 15–41)
Albumin: 3.5 g/dL (ref 3.5–5.0)
Alkaline Phosphatase: 49 U/L (ref 38–126)
Anion gap: 10 (ref 5–15)
BUN: 9 mg/dL (ref 6–20)
CO2: 25 mmol/L (ref 22–32)
Calcium: 9 mg/dL (ref 8.9–10.3)
Chloride: 106 mmol/L (ref 98–111)
Creatinine, Ser: 1.1 mg/dL (ref 0.61–1.24)
GFR, Estimated: >60 mL/min (ref >60)
Glucose, Bld: 112 mg/dL — ABNORMAL HIGH (ref 70–99)
Potassium: 3.9 mmol/L (ref 3.5–5.1)
Sodium: 141 mmol/L (ref 135–145)
Total Bilirubin: 1.2 mg/dL (ref 0.3–1.2)
Total Protein: 6.3 g/dL — ABNORMAL LOW (ref 6.5–8.1)

## 2020-08-19 LAB — BASIC METABOLIC PANEL
Anion gap: 9 (ref 5–15)
BUN: 10 mg/dL (ref 6–20)
CO2: 25 mmol/L (ref 22–32)
Calcium: 9.1 mg/dL (ref 8.9–10.3)
Chloride: 107 mmol/L (ref 98–111)
Creatinine, Ser: 1.2 mg/dL (ref 0.61–1.24)
GFR, Estimated: >60 mL/min (ref >60)
Glucose, Bld: 114 mg/dL — ABNORMAL HIGH (ref 70–99)
Potassium: 3.9 mmol/L (ref 3.5–5.1)
Sodium: 141 mmol/L (ref 135–145)

## 2020-08-19 LAB — CK
Total CK: 1015 U/L — ABNORMAL HIGH (ref 49–397)
Total CK: 1135 U/L — ABNORMAL HIGH (ref 49–397)

## 2020-08-19 LAB — TROPONIN I (HIGH SENSITIVITY): Troponin I (High Sensitivity): 8 ng/L (ref <18)

## 2020-08-19 LAB — HIV ANTIBODY (ROUTINE TESTING W REFLEX): HIV Screen 4th Generation wRfx: NONREACTIVE
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
## 2020-08-19 MED ORDER — SODIUM CHLORIDE 0.9 % IV SOLN: INTRAVENOUS | Status: DC

## 2020-08-19 MED ORDER — IBUPROFEN 200 MG PO TABS: 600.0000 mg | ORAL_TABLET | Freq: Four times a day (QID) | ORAL | Status: DC | PRN

## 2020-08-19 MED ORDER — TETANUS-DIPHTH-ACELL PERTUSSIS 5-2.5-18.5 LF-MCG/0.5 IM SUSY
0.5000 mL | PREFILLED_SYRINGE | Freq: Once | INTRAMUSCULAR | Status: AC
  Administered 2020-08-19: 0.5 mL via INTRAMUSCULAR
  Filled 2020-08-19: qty 0.5

## 2020-08-19 MED ORDER — LACTATED RINGERS IV SOLN: INTRAVENOUS | Status: DC

## 2020-08-19 MED ORDER — ACETAMINOPHEN 325 MG PO TABS
650.0000 mg | ORAL_TABLET | ORAL | Status: DC | PRN
  Administered 2020-08-19: 650 mg via ORAL
  Filled 2020-08-19: qty 2

## 2020-08-19 NOTE — ED Notes (Signed)
Pt is sleeping at this time. Will obtain vitals when pt wakes up

## 2020-08-19 NOTE — ED Notes (Signed)
The pt was walked in the  [place] he is still a little unsteady on his feet  His lt face is still swollen

## 2020-08-19 NOTE — ED Notes (Signed)
Lt sided of the pts face is still swollen

## 2020-08-19 NOTE — ED Notes (Signed)
Breakfast Ordered 

## 2020-08-19 NOTE — Progress Notes (Signed)
Inpatient Rehab Admissions Coordinator Note:   Per therapy recommendations, pt was screened for CIR candidacy by Clemens Catholic, [place] CCC-SLP. At this time, Pt. Remains observation status. He appears to have functional deficits and may be a good candidate for CIR if he meets criteria for inpatient admission. I will hold off on placing CIR consult and re-screen if patient status changes to inpatient. Please contact me with questions.   Clemens Catholic, [place], [place] Admissions Coordinator  [phone] ([place]) [phone] (office)

## 2020-08-19 NOTE — ED Notes (Signed)
Per charge nurse on 4N they are unable to take report at this time.

## 2020-08-19 NOTE — ED Notes (Signed)
Pt reports that he wants to go home

## 2020-08-19 NOTE — ED Notes (Signed)
Lunch Tray Ordered @ 1109. °

## 2020-08-19 NOTE — ED Notes (Signed)
The pt is sleeplng his wife called and wanted to speajk to him  She was told that the pt was sound asleep

## 2020-08-19 NOTE — ED Notes (Signed)
The pt returned from [facility] he has been asleep since 1500

## 2020-08-19 NOTE — ED Notes (Signed)
sleepiing still
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
## 2020-08-20 LAB — COMPREHENSIVE METABOLIC PANEL
ALT: 23 U/L (ref 0–44)
AST: 36 U/L (ref 15–41)
Albumin: 3.2 g/dL — ABNORMAL LOW (ref 3.5–5.0)
Alkaline Phosphatase: 41 U/L (ref 38–126)
Anion gap: 10 (ref 5–15)
BUN: 11 mg/dL (ref 6–20)
CO2: 23 mmol/L (ref 22–32)
Calcium: 8.6 mg/dL — ABNORMAL LOW (ref 8.9–10.3)
Chloride: 107 mmol/L (ref 98–111)
Creatinine, Ser: 1.13 mg/dL (ref 0.61–1.24)
GFR, Estimated: >60 mL/min (ref >60)
Glucose, Bld: 103 mg/dL — ABNORMAL HIGH (ref 70–99)
Potassium: 4 mmol/L (ref 3.5–5.1)
Sodium: 140 mmol/L (ref 135–145)
Total Bilirubin: 0.9 mg/dL (ref 0.3–1.2)
Total Protein: 6 g/dL — ABNORMAL LOW (ref 6.5–8.1)

## 2020-08-20 LAB — CK: Total CK: 812 U/L — ABNORMAL HIGH (ref 49–397)

## 2020-08-20 LAB — MAGNESIUM: Magnesium: 2.1 mg/dL (ref 1.7–2.4)

## 2020-08-20 LAB — PHOSPHORUS: Phosphorus: 2.8 mg/dL (ref 2.5–4.6)

## 2020-08-20 MED ORDER — ACETAMINOPHEN 325 MG PO TABS: 650.0000 mg | ORAL_TABLET | ORAL | Status: DC | PRN
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
## 2020-08-20 NOTE — Progress Notes (Signed)
Discharge teaching complete. Meds, diet, activity, follow up appointments and restrictions reviewed and all questions answered. Copy of instructions given to patient and son at bedside during education.

## 2020-08-20 NOTE — Progress Notes (Signed)
Inpatient Rehab Admissions Coordinator:   Therapy recommendations updated to no f/u.  Will sign off for CIR at this time.   Shann Medal, PT, DPT Admissions Coordinator [phone] 08/20/20  2:26 PM
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
## 2020-08-20 NOTE — Progress Notes (Signed)
Patient discharged home via wheelchair with son.  

## 2020-08-20 NOTE — Discharge Summary (Signed)
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 08/20/2020, 2:11 PM

## 2020-08-20 NOTE — TOC CAGE-AID Note (Signed)
Transition of Care (TOC) - CAGE-AID Screening Marvetta Gibbons RN,BSN Transitions of Care Unit 4NP (non trauma) - RN Case Manager See Treatment Team for direct Phone #   Patient Details  Name: Leonard Henderson MRN: [id number] Date of Birth: 07-01-1971  Transition of Care [facility]) CM/SW Contact:    Dawayne Patricia, RN Phone Number: 08/20/2020, 11:32 AM   Clinical Narrative: Cage-Aid screening completed, pt with hx of ETOH use, per pt he drinks about every 2 weeks (a few drinks), does not feel like he needs any resources to stop at this time and declines any info on resources or education. Pt lives at home with wife and is independent usually with his ADLs.     CAGE-AID Screening:    Have You Ever Felt You Ought to Cut Down on Your Drinking or Drug Use?: Yes Have People Annoyed You By Critizing Your Drinking Or Drug Use?: No Have You Felt Bad Or Guilty About Your Drinking Or Drug Use?:  (sometimes) Have You Ever Had a Drink or Used Drugs First Thing In The Morning to Steady Your Nerves or to Get Rid of a Hangover?:  (sometimes) CAGE-AID Score: 3  Substance Abuse Education Offered: Yes (Pt declined)

## 2020-08-20 NOTE — TOC Transition Note (Signed)
Transition of Care (TOC) - CM/SW Discharge Note Marvetta Gibbons RN,BSN Transitions of Care Unit 4NP (non trauma) - RN Case Manager See Treatment Team for direct Phone #   Patient Details  Name: Leonard Henderson MRN: [id number] Date of Birth: Apr 05, 1971  Transition of Care [facility]) CM/SW Contact:  Dawayne Patricia, RN Phone Number: 08/20/2020, 2:18 PM   Clinical Narrative:    Pt has been cleared for transition home, CM spoke with pt at bedside- per pt he lives at home with wife, address confirmed in epic. Discussed getting a RW for home- pt agreeable awaiting PT to see pt.  Order has been placed for RW- per PT no f/u needed. Call made to [employer] for DME need under charity as pt has no insurance- RW to be delivered to room prior to discharge.   Pt to return home with wife.    Final next level of care: Home/Self Care Barriers to Discharge: No Barriers Identified   Patient Goals and CMS Choice Patient states their goals for this hospitalization and ongoing recovery are:: return home CMS Medicare.gov Compare Post Acute Care list provided to:: Patient Choice offered to / list presented to : Patient  Discharge Placement               Home        Discharge Plan and Services   Discharge Planning Services: CM Consult Post Acute Care Choice: Durable Medical Equipment          DME Arranged: Walker rolling DME Agency: [employer] Date DME Agency Contacted: 08/20/20 Time DME Agency Contacted: 29 Representative spoke with at DME Agency: [place]: NA [place] Agency: NA        Social Determinants of Health ([place]) Interventions     Readmission Risk Interventions Readmission Risk Prevention Plan 08/20/2020  Post Dischage Appt Complete  Medication Screening Complete  Transportation Screening Complete
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
## 2020-08-26 LAB — VITAMIN B1: Vitamin B1 (Thiamine): 125.3 nmol/L (ref 66.5–200.0)

## 2020-08-28 ENCOUNTER — Ambulatory Visit (INDEPENDENT_AMBULATORY_CARE_PROVIDER_SITE_OTHER): Payer: BC Managed Care – PPO | Admitting: Family Medicine

## 2020-08-28 ENCOUNTER — Encounter: Payer: Self-pay | Admitting: Family Medicine

## 2020-08-28 ENCOUNTER — Other Ambulatory Visit: Payer: Self-pay

## 2020-08-28 VITALS — BP 106/72 | HR 63 | Temp 97.8°F | Resp 18 | Ht 72.0 in | Wt 151.7 lb

## 2020-08-28 DIAGNOSIS — S060X9A Concussion with loss of consciousness of unspecified duration, initial encounter: Secondary | ICD-10-CM

## 2020-08-28 DIAGNOSIS — S0990XA Unspecified injury of head, initial encounter: Secondary | ICD-10-CM | POA: Diagnosis not present

## 2020-08-28 DIAGNOSIS — R270 Ataxia, unspecified: Secondary | ICD-10-CM | POA: Diagnosis not present

## 2020-08-28 NOTE — Assessment & Plan Note (Signed)
See concussion AP
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
## 2020-08-28 NOTE — Patient Instructions (Signed)
A referral to Neurology has been placed today.  If you have not heard from the specialty office or our referral coordinator within 1 week, please let us know and we will follow up with the referral coordinator for an update.  We will plan to see you back in 3 months for follow up visit  You will receive a survey after today's visit either digitally by e-mail or paper by [place] mail. Your experiences and feedback matter to [place].  Please respond so we know how we are doing as we provide care for you.  Call us with any questions/concerns/needs.  It is my goal to be available to you for your health concerns.  Thanks for choosing me to be a partner in your healthcare needs!  Harlin Rain, FNP-C Family Nurse Practitioner [facility] Phone: [phone]

## 2020-08-28 NOTE — Assessment & Plan Note (Signed)
Urgent referral to neurology for evaluation of ataxia after head injury with LOC.  Numbness in left lower extremity causing continued ataxia.

## 2020-08-29 ENCOUNTER — Inpatient Hospital Stay: Payer: Self-pay | Admitting: Family Medicine

## 2020-09-18 ENCOUNTER — Other Ambulatory Visit: Payer: Self-pay

## 2020-09-18 ENCOUNTER — Ambulatory Visit: Payer: BC Managed Care – PPO | Attending: Neurology

## 2020-09-18 DIAGNOSIS — M25552 Pain in left hip: Secondary | ICD-10-CM | POA: Diagnosis present

## 2020-09-18 DIAGNOSIS — R2681 Unsteadiness on feet: Secondary | ICD-10-CM | POA: Diagnosis not present
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
## 2020-09-25 ENCOUNTER — Ambulatory Visit: Payer: BC Managed Care – PPO

## 2020-09-25 ENCOUNTER — Other Ambulatory Visit: Payer: Self-pay

## 2020-09-25 DIAGNOSIS — R2681 Unsteadiness on feet: Secondary | ICD-10-CM | POA: Diagnosis not present

## 2020-09-25 DIAGNOSIS — M25552 Pain in left hip: Secondary | ICD-10-CM

## 2020-09-25 NOTE — Patient Instructions (Signed)
Access Code: [email or web address] URL: [email or web address] Date: 09/25/2020 Prepared by: Roxana Hires  Exercises Seated Gaze Stabilization with Head Rotation - 4 x daily - 7 x weekly - 3 reps - 60 seconds hold
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
## 2020-09-26 ENCOUNTER — Telehealth: Payer: Self-pay

## 2020-09-26 NOTE — Telephone Encounter (Signed)
The pt stop by the office requesting a letter to be written to extend his work excuse. He contacted Dr. Manuella Ghazi office to request the note and was told he need to f/u with his PCP, because Dr. Manuella Ghazi only took him out for 1 mth. The patient seems very confused and state he is not in any shape to return to work at this time.   I contacted Dr. Manuella Ghazi office and was told that they will send another message to Dr. Manuella Ghazi to clarify. I ask receptionist to have the nurse to clarify if Dr. Manuella Ghazi is stating that the patient issue is not neurology related.

## 2020-09-27 NOTE — Telephone Encounter (Signed)
I called the patient to f/u on his request for a work note to keep him out of work. He informed me that the Neurologist Office, Dr. Manuella Ghazi called and scheduled an appt for next Thursday.

## 2020-10-02 ENCOUNTER — Ambulatory Visit: Payer: BC Managed Care – PPO | Attending: Neurology

## 2020-10-02 DIAGNOSIS — R42 Dizziness and giddiness: Secondary | ICD-10-CM | POA: Insufficient documentation

## 2020-10-02 DIAGNOSIS — R2681 Unsteadiness on feet: Secondary | ICD-10-CM | POA: Insufficient documentation

## 2020-10-02 DIAGNOSIS — M25552 Pain in left hip: Secondary | ICD-10-CM | POA: Insufficient documentation

## 2020-10-04 NOTE — Telephone Encounter (Signed)
I spoke with the patient and he informed me that he received a call from [facility] stating that they reached out to Dr. Manuella Ghazi office on yesterday and they informed them that Dr. Manuella Ghazi will not fill out the paperwork. He said they said that his  PCP office is responsible for that paperwork.   I called and spoke with April from [facility]. She informed me that Dr. Manuella Ghazi will fill out the form for the 3-4 week extension for Short term disability. She said just have the patient to call [employer] and have them to fax over the information.   The patient was notified and verbalize understanding.

## 2020-10-04 NOTE — Telephone Encounter (Addendum)
Patient was seen by Dr. Manuella Ghazi on 10/03/2020 and [place] is requesting additional office notes possible reflecting specialist visit, patient unsure. Caller states Shawna Orleans should have faxed over form requesting additional information from PCP, please note when received.    Dr. Manuella Ghazi advised patient to follow up with PCP regarding continuing short term disability and the necessary documents needed for [facility].   Patient would like a follow up call today.

## 2020-10-09 ENCOUNTER — Ambulatory Visit: Payer: BC Managed Care – PPO

## 2020-10-15 ENCOUNTER — Ambulatory Visit: Payer: BC Managed Care – PPO | Admitting: Physical Therapy

## 2020-10-17 ENCOUNTER — Ambulatory Visit: Payer: BC Managed Care – PPO

## 2020-10-17 ENCOUNTER — Other Ambulatory Visit: Payer: Self-pay

## 2020-10-17 DIAGNOSIS — M25552 Pain in left hip: Secondary | ICD-10-CM

## 2020-10-17 DIAGNOSIS — R42 Dizziness and giddiness: Secondary | ICD-10-CM

## 2020-10-17 DIAGNOSIS — R2681 Unsteadiness on feet: Secondary | ICD-10-CM | POA: Diagnosis not present
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
## 2020-10-17 NOTE — Patient Instructions (Signed)
Access Code: [email or web address] URL: [email or web address] Date: 10/17/2020 Prepared by: Roxana Hires  Exercises Seated Gaze Stabilization with Head Rotation - 4 x daily - 7 x weekly - 3 reps - 60 seconds hold Hooklying Single Knee to Chest Stretch - 2 x daily - 7 x weekly - 3 reps - 45s hold Supine Piriformis Stretch with Foot on Ground - 2 x daily - 7 x weekly - 3 reps - 45s hold Supine Figure 4 Piriformis Stretch - 2 x daily - 7 x weekly - 3 reps - 45s hold

## 2020-10-24 ENCOUNTER — Ambulatory Visit: Payer: BC Managed Care – PPO

## 2020-10-24 DIAGNOSIS — R2681 Unsteadiness on feet: Secondary | ICD-10-CM

## 2020-10-24 DIAGNOSIS — R42 Dizziness and giddiness: Secondary | ICD-10-CM

## 2020-10-29 ENCOUNTER — Ambulatory Visit: Payer: BC Managed Care – PPO

## 2020-10-29 NOTE — Patient Instructions (Incomplete)
TREATMENT   Neuromuscular Re-education  VOR x 1 horizontal in sitting with plain background 3 x 60s, 4/10 dizziness reported after first rep, 2.5/10 after second, and 2/10 after third.  Rest breaks between sets, education about importance of performing at home consistently;   Ther-ex  NuStep L0-1 for warm-up during interval history x 6 minutes; Updated FOTO for vestibular issues with patient; Pt complete LEFS:  Limited LE assessment (see below)  L hip stretches and strengthening, vestibular adaptation and habituation exercises

## 2020-10-31 ENCOUNTER — Other Ambulatory Visit: Payer: Self-pay

## 2020-10-31 ENCOUNTER — Ambulatory Visit: Payer: BC Managed Care – PPO

## 2020-10-31 DIAGNOSIS — R42 Dizziness and giddiness: Secondary | ICD-10-CM

## 2020-10-31 DIAGNOSIS — R2681 Unsteadiness on feet: Secondary | ICD-10-CM

## 2020-10-31 DIAGNOSIS — M25552 Pain in left hip: Secondary | ICD-10-CM
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
## 2020-11-07 ENCOUNTER — Ambulatory Visit: Payer: BLUE CROSS/BLUE SHIELD

## 2020-11-07 NOTE — Patient Instructions (Incomplete)
TREATMENT   Neuromuscular Re-education VOR x 1 horizontal in sitting with plain background 2 x 60s,3/10 dizziness reported after first rep and 2/10 after second rep; VOR x 1 horizontal in standing with plain background x 60s; Rest breaks between sets, education about progression at home;   Ther-ex NuStep L1 for warm-up x 10 minutes (unbilled); Supine L hip single knee to chest stretch 2 x 45s; Supine L hip FABER stretch 2 x 45s; Supine L hip FADIR stretch 2 x 45s; Supine L SLR with transverse abdominis contraction 2 x 10; R sidelying L straight leg hip abduction 2 x 10; R sidelying L clams with manual resistance from therapist 2 x 10; L single leg bridge 2 x 10; Total Gym L22 L single leg squats 2 x 10;   Pt educated throughout session about proper posture and technique with exercises. Improved exercise technique, movement at target joints, use of target muscles after min to mod verbal, visual, tactile cues.    Patient demonstrates excellent motivation during session today.  Repeated left hip and low back stretches but also introduced left hip, abdominal, and low back strengthening.  Patient is able to complete all exercise without pain but with notable fatigue.  Repeated VOR x1 horizontal with patient in sitting and he is reporting less dizziness today.  Progressed to standing which is more challenging and aggravating for patient.  Progressed HEP to include VOR x1 horizontal in standing and also added left hip strengthening to his program.  Patient encouraged to follow-up as scheduled. Pt will benefit from PT services to address deficits in strength, balance, and mobility in order to return to full function at home.

## 2020-11-08 ENCOUNTER — Other Ambulatory Visit: Payer: Self-pay

## 2020-11-08 ENCOUNTER — Ambulatory Visit: Payer: BLUE CROSS/BLUE SHIELD | Attending: Physician Assistant

## 2020-11-08 DIAGNOSIS — R2681 Unsteadiness on feet: Secondary | ICD-10-CM | POA: Diagnosis not present

## 2020-11-08 DIAGNOSIS — R42 Dizziness and giddiness: Secondary | ICD-10-CM | POA: Diagnosis present
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
## 2020-11-12 NOTE — Patient Instructions (Incomplete)
TREATMENT   Ther-ex SciFitL4 x 10 minutes for warm-up (unbilled); Total Gym L22 BLE squats x 20; Total Gym L22 L single leg squats 2 x 15; TRX reverse lunges with LLE forward and RLE stepping back 2 x 10; TRX L lateral lunges 2 x 10, pt requires repetitive verbal cues to perform correctly; Resisted side stepping with green tband around ankles 1 minute x 2; Supine L SLR withtransverse abdominis contractionx 20, added 2# ankle weight x 10; R sidelying L straight leg hip abduction with 2# ankle weight 2 x 10; L single leg bridge 2 x 10;   Neuromuscular Re-education VOR x 1 horizontal during forward/retro ambulation 3 x 75' each, pt reports no dizziness during forward ambulation and only minimal dizziness during retro ambulation;   Pt educated throughout session about proper posture and technique with exercises. Improved exercise technique, movement at target joints, use of target muscles after min to mod verbal, visual, tactile cues.    Patient demonstrates excellent motivation during session today. Session today focused on strengthening for LLE and hip. Pt denies any increase in pain during all exercises. Increased challenge today by incorporating TRX strengthening and attempting to isolate LLE on mat table.Patient is able to complete all exercise without painbut with notable fatigue. Repeated VOR x1 horizontal with patient today but performed during forward and retro ambulation.Patient encouraged to follow-up as scheduled. Pt will benefit from PT services to address deficits in strength, balance, and mobility in order to return to full function at home.

## 2020-11-14 ENCOUNTER — Ambulatory Visit: Payer: BLUE CROSS/BLUE SHIELD

## 2020-11-14 DIAGNOSIS — R42 Dizziness and giddiness: Secondary | ICD-10-CM

## 2020-11-14 DIAGNOSIS — R2681 Unsteadiness on feet: Secondary | ICD-10-CM

## 2020-11-21 ENCOUNTER — Ambulatory Visit: Payer: BLUE CROSS/BLUE SHIELD

## 2020-11-25 ENCOUNTER — Ambulatory Visit (INDEPENDENT_AMBULATORY_CARE_PROVIDER_SITE_OTHER): Payer: BLUE CROSS/BLUE SHIELD | Admitting: Internal Medicine

## 2020-11-25 ENCOUNTER — Other Ambulatory Visit: Payer: Self-pay

## 2020-11-25 ENCOUNTER — Encounter: Payer: Self-pay | Admitting: Internal Medicine

## 2020-11-25 DIAGNOSIS — S0990XA Unspecified injury of head, initial encounter: Secondary | ICD-10-CM | POA: Diagnosis not present

## 2020-11-25 DIAGNOSIS — F0781 Postconcussional syndrome: Secondary | ICD-10-CM | POA: Diagnosis not present

## 2020-11-25 DIAGNOSIS — R7303 Prediabetes: Secondary | ICD-10-CM

## 2020-11-25 DIAGNOSIS — R270 Ataxia, unspecified: Secondary | ICD-10-CM | POA: Diagnosis not present

## 2020-11-25 NOTE — Assessment & Plan Note (Signed)
No headaches, dizziness or visual changes He will continue to follow with neurology until released

## 2020-11-25 NOTE — Patient Instructions (Signed)
Diabetes Care, 44(Suppl 1), D40-C14. https://doi.org/https://doi.org/10.2337/dc21-S003">  Prediabetes Prediabetes is when your blood sugar (blood glucose) level is higher than normal but not high enough for you to be diagnosed with type 2 diabetes. Having prediabetes puts you at risk for developing type 2 diabetes (type 2 diabetes mellitus). With certain lifestyle changes, you may be able to prevent or delay the onset of type 2 diabetes. This is important because type 2 diabetes can lead to serious complications, such as:  Heart disease.  Stroke.  Blindness.  Kidney disease.  Depression.  Poor circulation in the feet and legs. In severe cases, this could lead to surgical removal of a leg (amputation). What are the causes? The exact cause of prediabetes is not known. It may result from insulin resistance. Insulin resistance develops when cells in the body do not respond properly to insulin that the body makes. This can cause excess glucose to build up in the blood. High blood glucose (hyperglycemia) can develop. What increases the risk? The following factors may make you more likely to develop this condition:  You have a family member with type 2 diabetes.  You are older than 45 years.  You had a temporary form of diabetes during a pregnancy (gestational diabetes).  You had polycystic ovary syndrome (PCOS).  You are overweight or obese.  You are inactive (sedentary).  You have a history of heart disease, including problems with cholesterol levels, high levels of blood fats, or high blood pressure. What are the signs or symptoms? You may have no symptoms. If you do have symptoms, they may include:  Increased hunger.  Increased thirst.  Increased urination.  Vision changes, such as blurry vision.  Tiredness (fatigue). How is this diagnosed? This condition can be diagnosed with blood tests. Your blood glucose may be checked with one or more of the following tests:  A  fasting blood glucose (FBG) test. You will not be allowed to eat (you will fast) for at least 8 hours before a blood sample is taken.  An A1C blood test (hemoglobin A1C). This test provides information about blood glucose levels over the previous 2?3 months.  An oral glucose tolerance test (OGTT). This test measures your blood glucose at two points in time: ? After fasting. This is your baseline level. ? Two hours after you drink a beverage that contains glucose. You may be diagnosed with prediabetes if:  Your FBG is 100?125 mg/dL (5.6-6.9 mmol/L).  Your A1C level is 5.7?6.4% (39-46 mmol/mol).  Your OGTT result is 140?199 mg/dL (7.8-11 mmol/L). These blood tests may be repeated to confirm your diagnosis.   How is this treated? Treatment may include dietary and lifestyle changes to help lower your blood glucose and prevent type 2 diabetes from developing. In some cases, medicine may be prescribed to help lower the risk of type 2 diabetes. Follow these instructions at home: Nutrition  Follow a healthy meal plan. This includes eating lean proteins, whole grains, legumes, fresh fruits and vegetables, low-fat dairy products, and healthy fats.  Follow instructions from your health care provider about eating or drinking restrictions.  Meet with a dietitian to create a healthy eating plan that is right for you.   Lifestyle  Do moderate-intensity exercise for at least 30 minutes a day on 5 or more days each week, or as told by your health care provider. A mix of activities may be best, such as: ? Brisk walking, swimming, biking, and weight lifting.  Lose weight as told by your health  care provider. Losing 5-7% of your body weight can reverse insulin resistance.  Do not drink alcohol if: ? Your health care provider tells you not to drink. ? You are pregnant, may be pregnant, or are planning to become pregnant.  If you drink alcohol: ? Limit how much you use to:  0-1 drink a day for  women.  0-2 drinks a day for men. ? Be aware of how much alcohol is in your drink. In the [place] one drink equals one 12 oz bottle of beer (355 mL), one 5 oz glass of wine (148 mL), or one 1 oz glass of hard liquor (44 mL). General instructions  Take over-the-counter and prescription medicines only as told by your health care provider. You may be prescribed medicines that help lower the risk of type 2 diabetes.  Do not use any products that contain nicotine or tobacco, such as cigarettes, e-cigarettes, and chewing tobacco. If you need help quitting, ask your health care provider.  Keep all follow-up visits. This is important. Where to find more information  American Diabetes Association: www.diabetes.org  Academy of Nutrition and Dietetics: www.eatright.org  American Heart Association: www.heart.org Contact a health care provider if:  You have any of these symptoms: ? Increased hunger. ? Increased urination. ? Increased thirst. ? Fatigue. ? Vision changes, such as blurry vision. Get help right away if you:  Have shortness of breath.  Feel confused.  Vomit or feel like you may vomit. Summary  Prediabetes is when your blood sugar (blood glucose)level is higher than normal but not high enough for you to be diagnosed with type 2 diabetes.  Having prediabetes puts you at risk for developing type 2 diabetes (type 2 diabetes mellitus).  Make lifestyle changes such as eating a healthy diet and exercising regularly to help prevent diabetes. Lose weight as told by your health care provider. This information is not intended to replace advice given to you by your health care provider. Make sure you discuss any questions you have with your health care provider. Document Revised: 10/19/2019 Document Reviewed: 10/19/2019 Elsevier Patient Education  2021 Elsevier Inc.  

## 2020-11-25 NOTE — Assessment & Plan Note (Signed)
Encouraged him to consume a low-carb diet and exercise for 30 minutes at least 3 times weekly to help reduce blood sugar levels Handout given on prediabetes Will check A1c annually with physical
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
## 2020-11-25 NOTE — Assessment & Plan Note (Addendum)
Improving with PT/OT He still has some paresthesia/weakness of the left lower extremity He will continue to follow with neurology Will follow

## 2020-11-28 ENCOUNTER — Ambulatory Visit: Payer: BLUE CROSS/BLUE SHIELD

## 2021-01-16 ENCOUNTER — Other Ambulatory Visit: Payer: Self-pay

## 2021-01-16 ENCOUNTER — Encounter: Payer: Self-pay | Admitting: Internal Medicine

## 2021-01-16 ENCOUNTER — Ambulatory Visit (INDEPENDENT_AMBULATORY_CARE_PROVIDER_SITE_OTHER): Payer: BLUE CROSS/BLUE SHIELD | Admitting: Internal Medicine

## 2021-01-16 ENCOUNTER — Ambulatory Visit
Admission: RE | Admit: 2021-01-16 | Discharge: 2021-01-16 | Disposition: A | Discharge status: Home / Self Care | Payer: BLUE CROSS/BLUE SHIELD | Attending: Internal Medicine | Admitting: Internal Medicine

## 2021-01-16 ENCOUNTER — Ambulatory Visit
Admission: RE | Admit: 2021-01-16 | Discharge: 2021-01-16 | Disposition: A | Discharge status: Home / Self Care | Payer: BLUE CROSS/BLUE SHIELD | Source: Ambulatory Visit | Attending: Internal Medicine | Admitting: Internal Medicine

## 2021-01-16 VITALS — BP 117/81 | HR 68 | Ht 72.0 in | Wt 149.2 lb

## 2021-01-16 DIAGNOSIS — G8929 Other chronic pain: Secondary | ICD-10-CM | POA: Insufficient documentation

## 2021-01-16 DIAGNOSIS — M25511 Pain in right shoulder: Secondary | ICD-10-CM | POA: Diagnosis not present

## 2021-01-16 DIAGNOSIS — M25512 Pain in left shoulder: Secondary | ICD-10-CM | POA: Diagnosis not present

## 2021-01-16 IMAGING — DX DG SHOULDER 2+V*L*
3 series · 3 of 3 positions shown · non-contrast
Comparison: None.

CLINICAL DATA: Chronic bilateral shoulder pain, worsening over the
last 3 days, no known injury

EXAM:
RIGHT SHOULDER - 2+ VIEW; LEFT SHOULDER - 2+ VIEW

[shoulder ap]
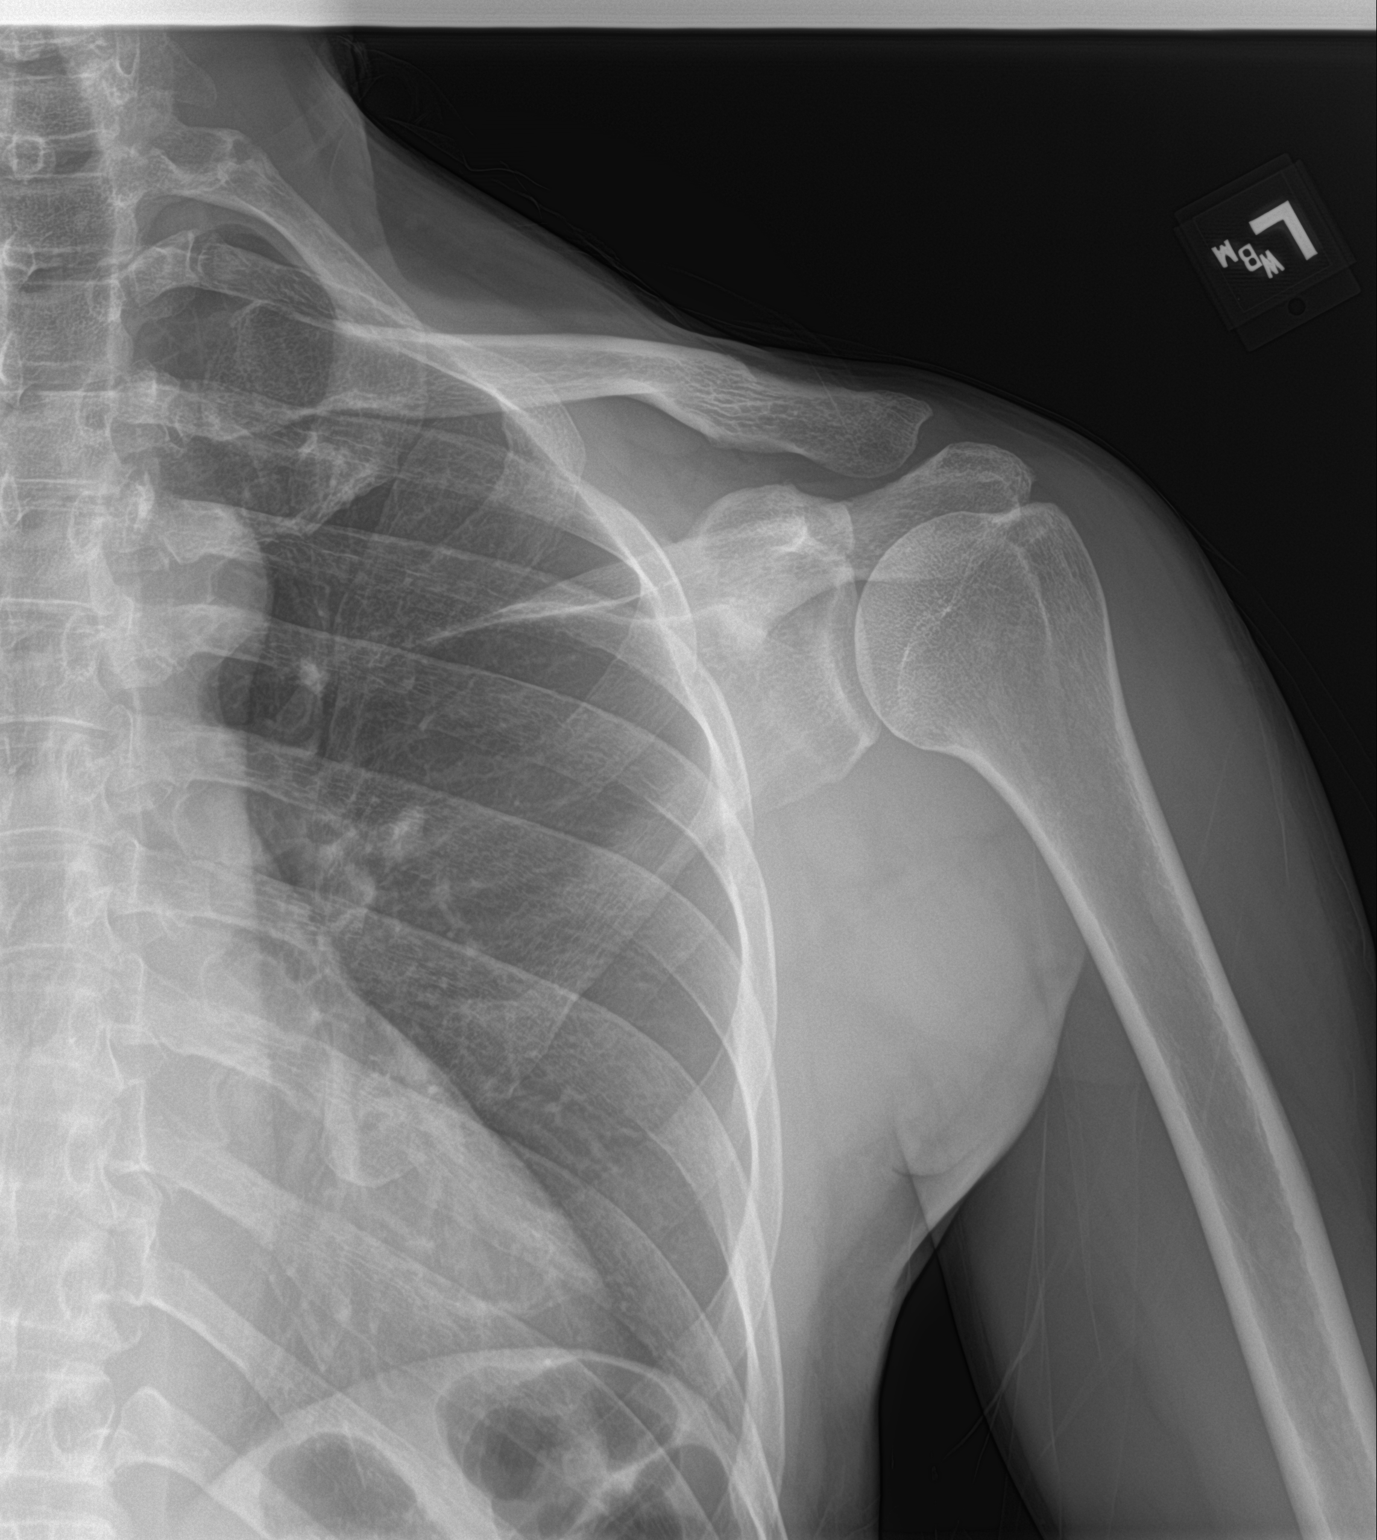

[shoulder y-view]
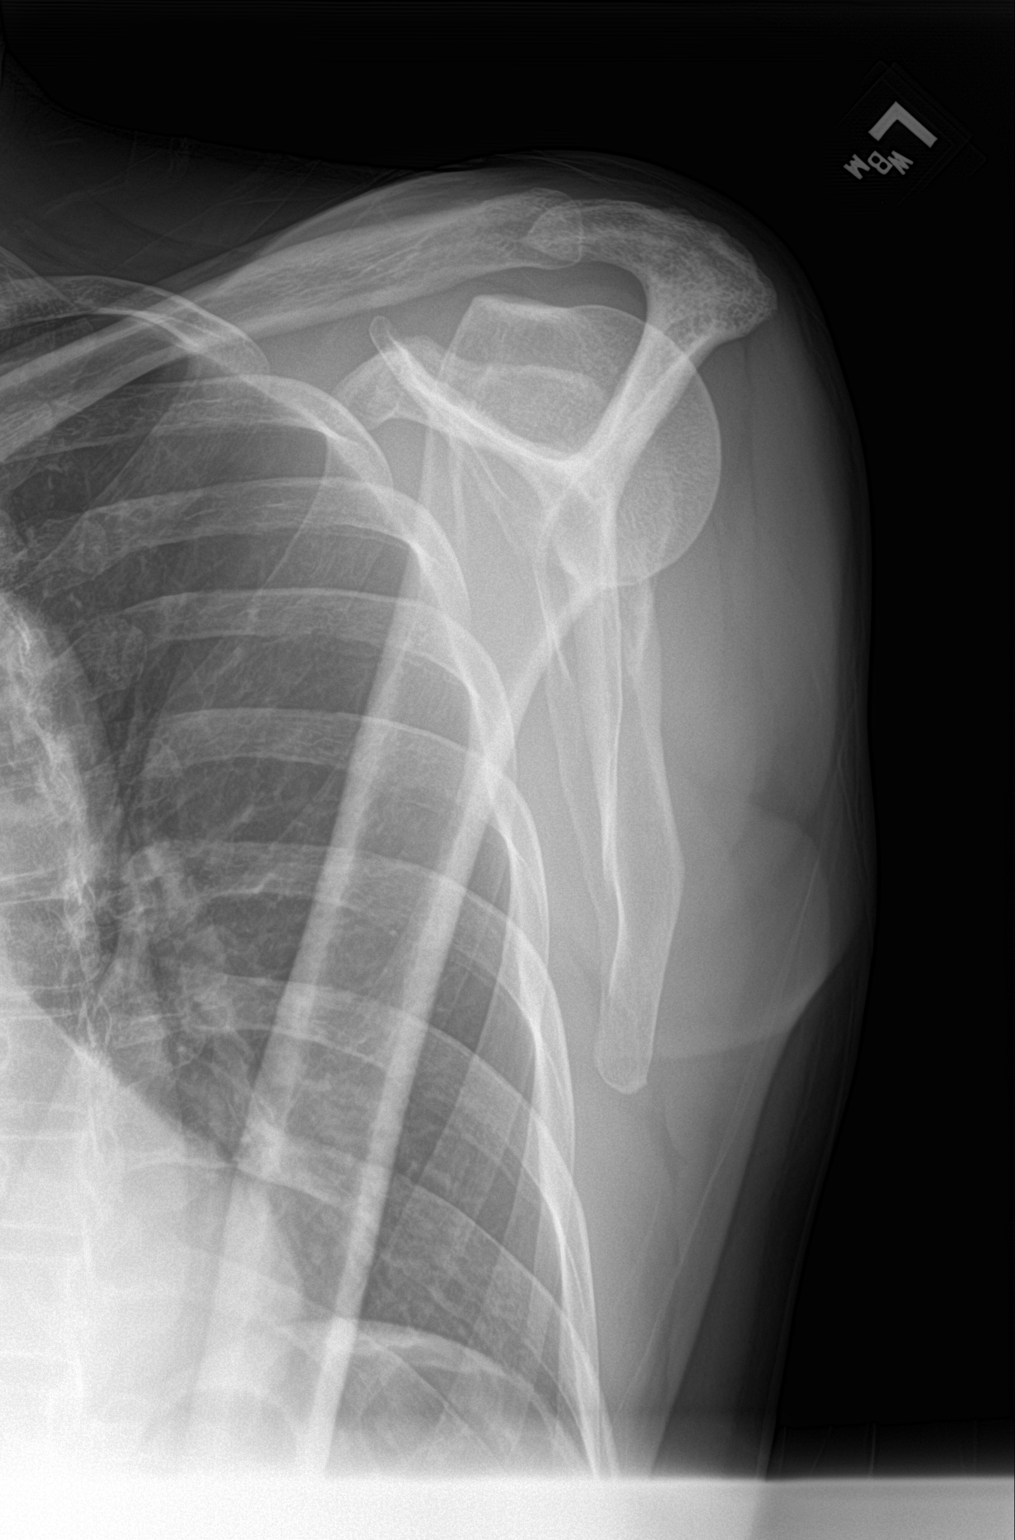

[shoulder axial]
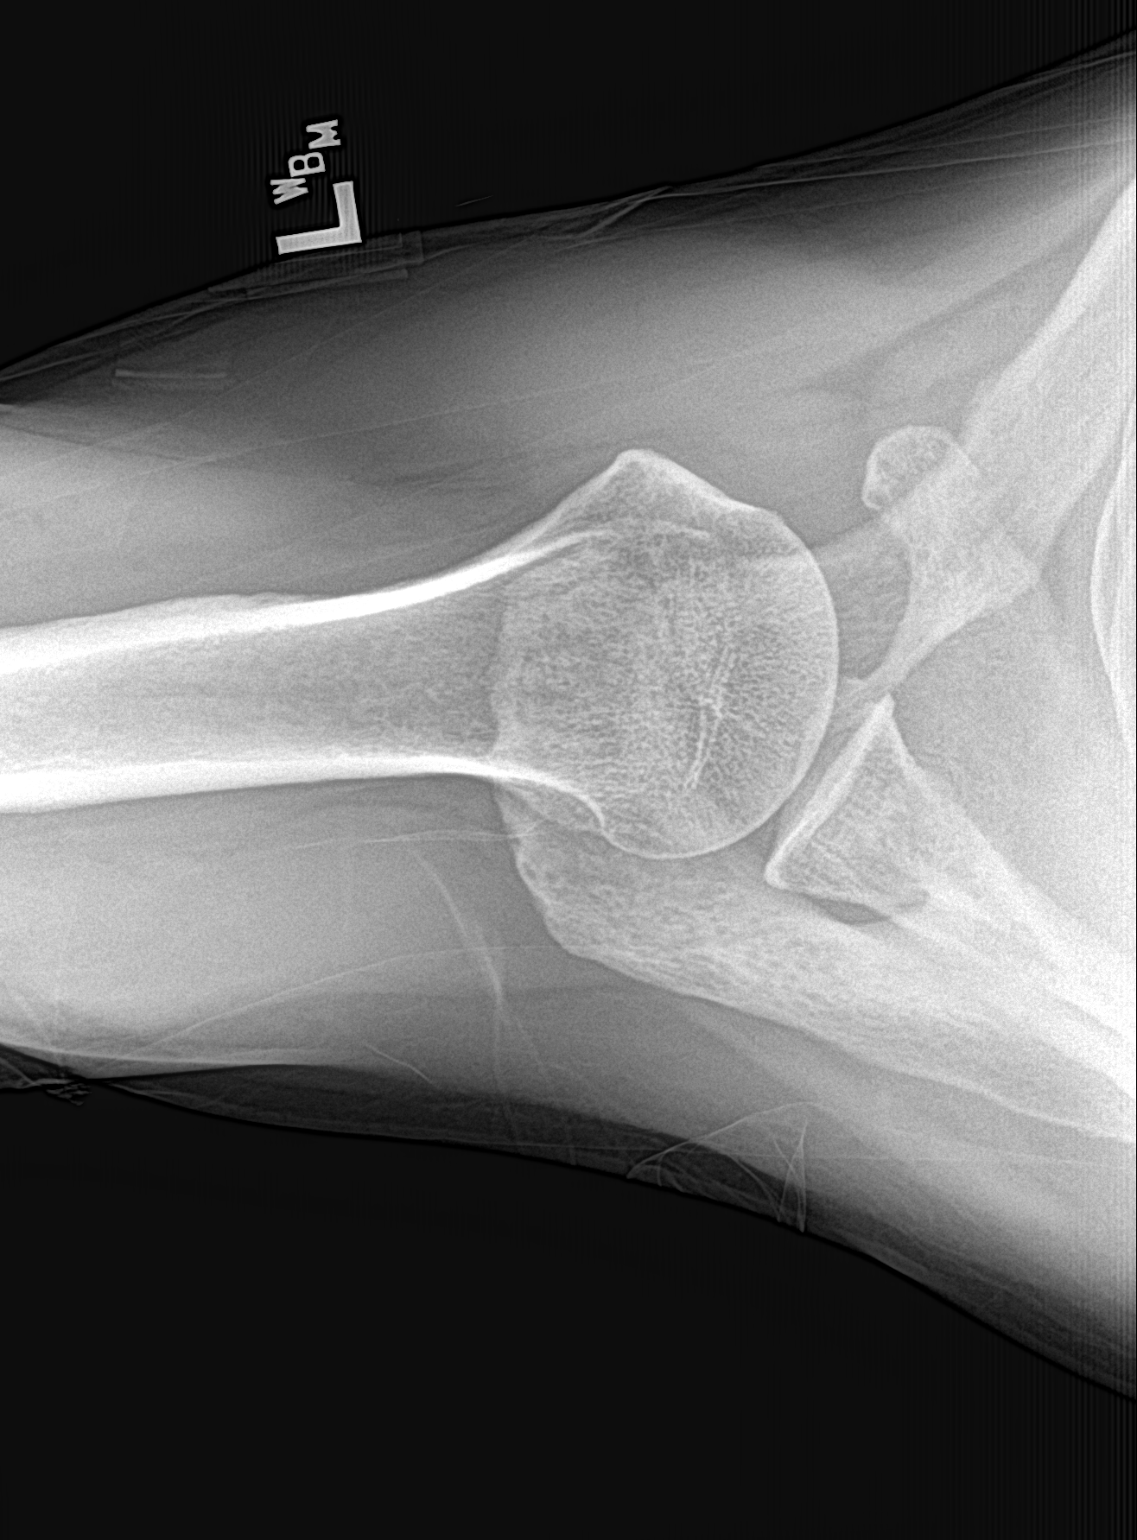

[3 of 3 positions shown; findings below may reference images not displayed]

FINDINGS: There is no evidence of fracture or dislocation. There is no
evidence of arthropathy or other focal bone abnormality. Soft
tissues are unremarkable.
IMPRESSION: No fracture or dislocation of the bilateral shoulders. Joint spaces
are well preserved.

## 2021-01-16 IMAGING — DX DG SHOULDER 2+V*R*
3 series · 3 of 3 positions shown · non-contrast
Comparison: None.

CLINICAL DATA: Chronic bilateral shoulder pain, worsening over the
last 3 days, no known injury

EXAM:
RIGHT SHOULDER - 2+ VIEW; LEFT SHOULDER - 2+ VIEW

[shoulder ap]
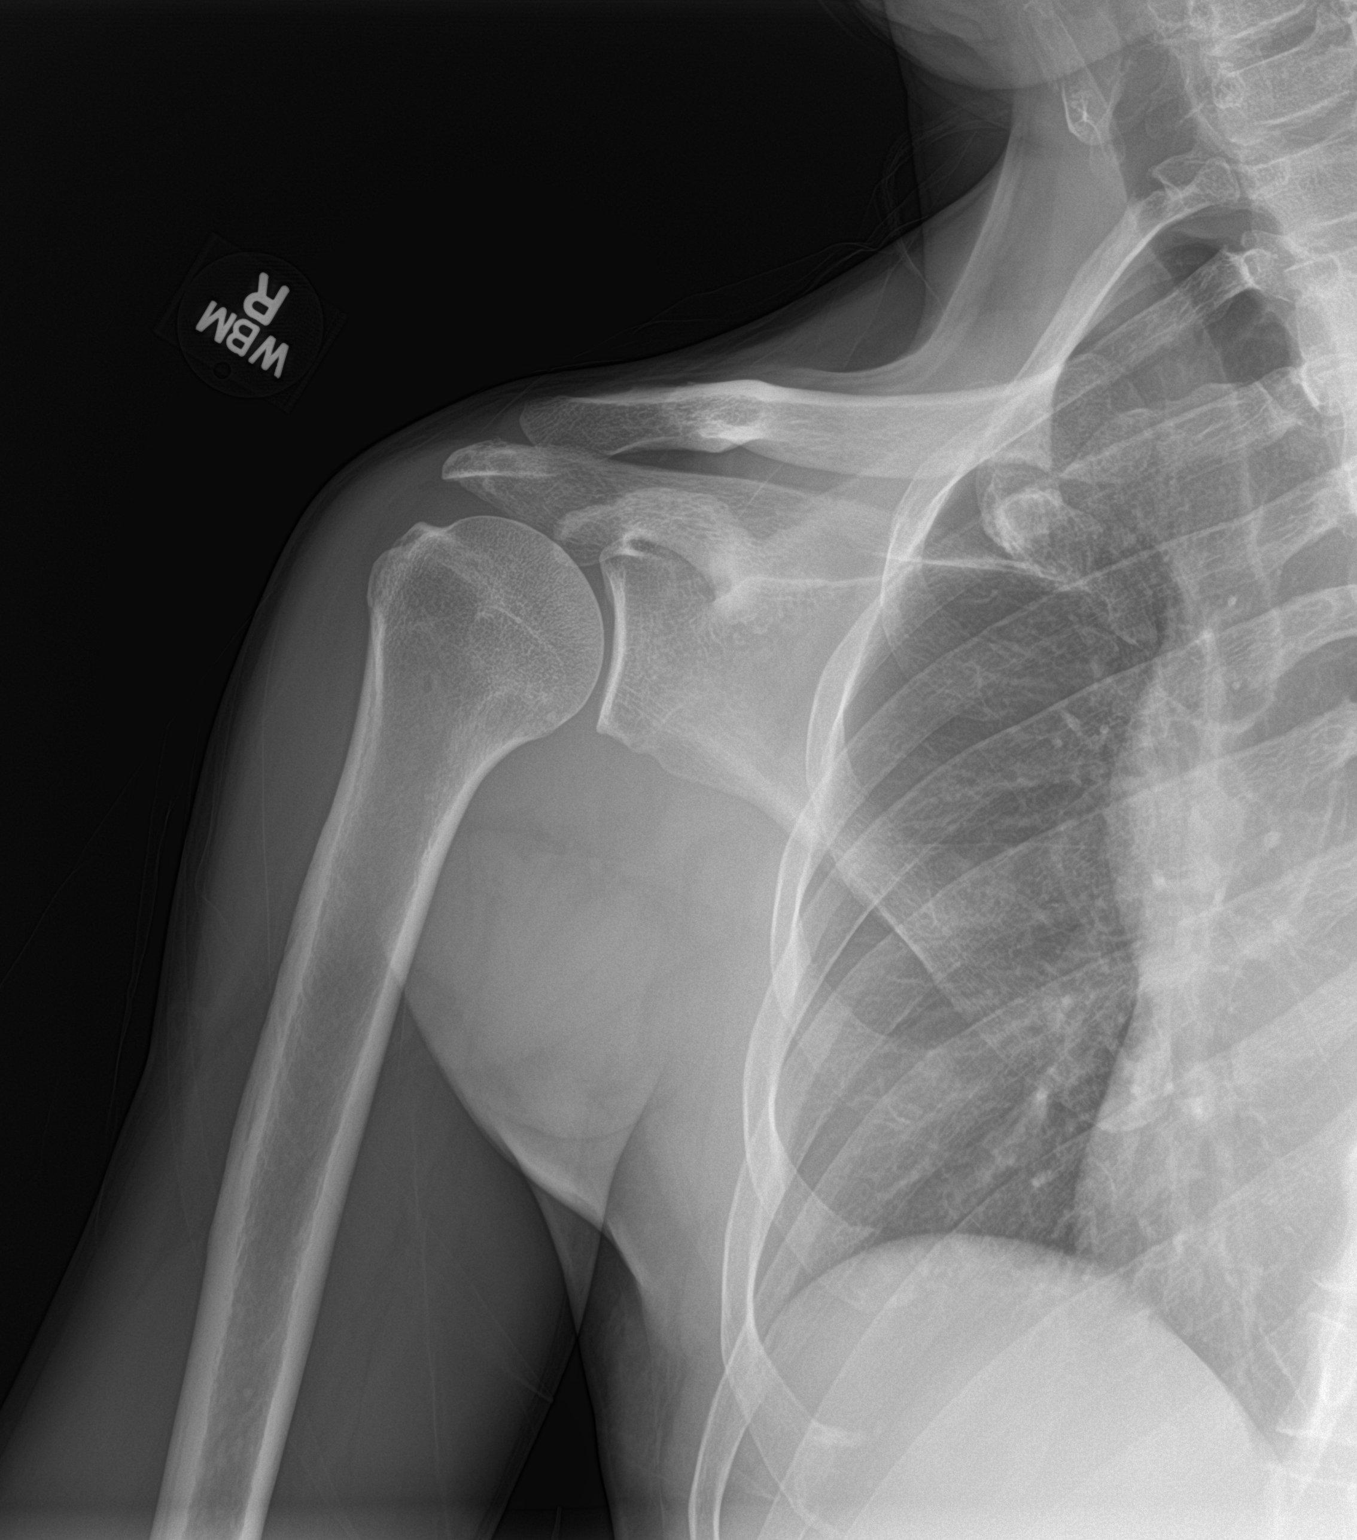

[shoulder y-view]
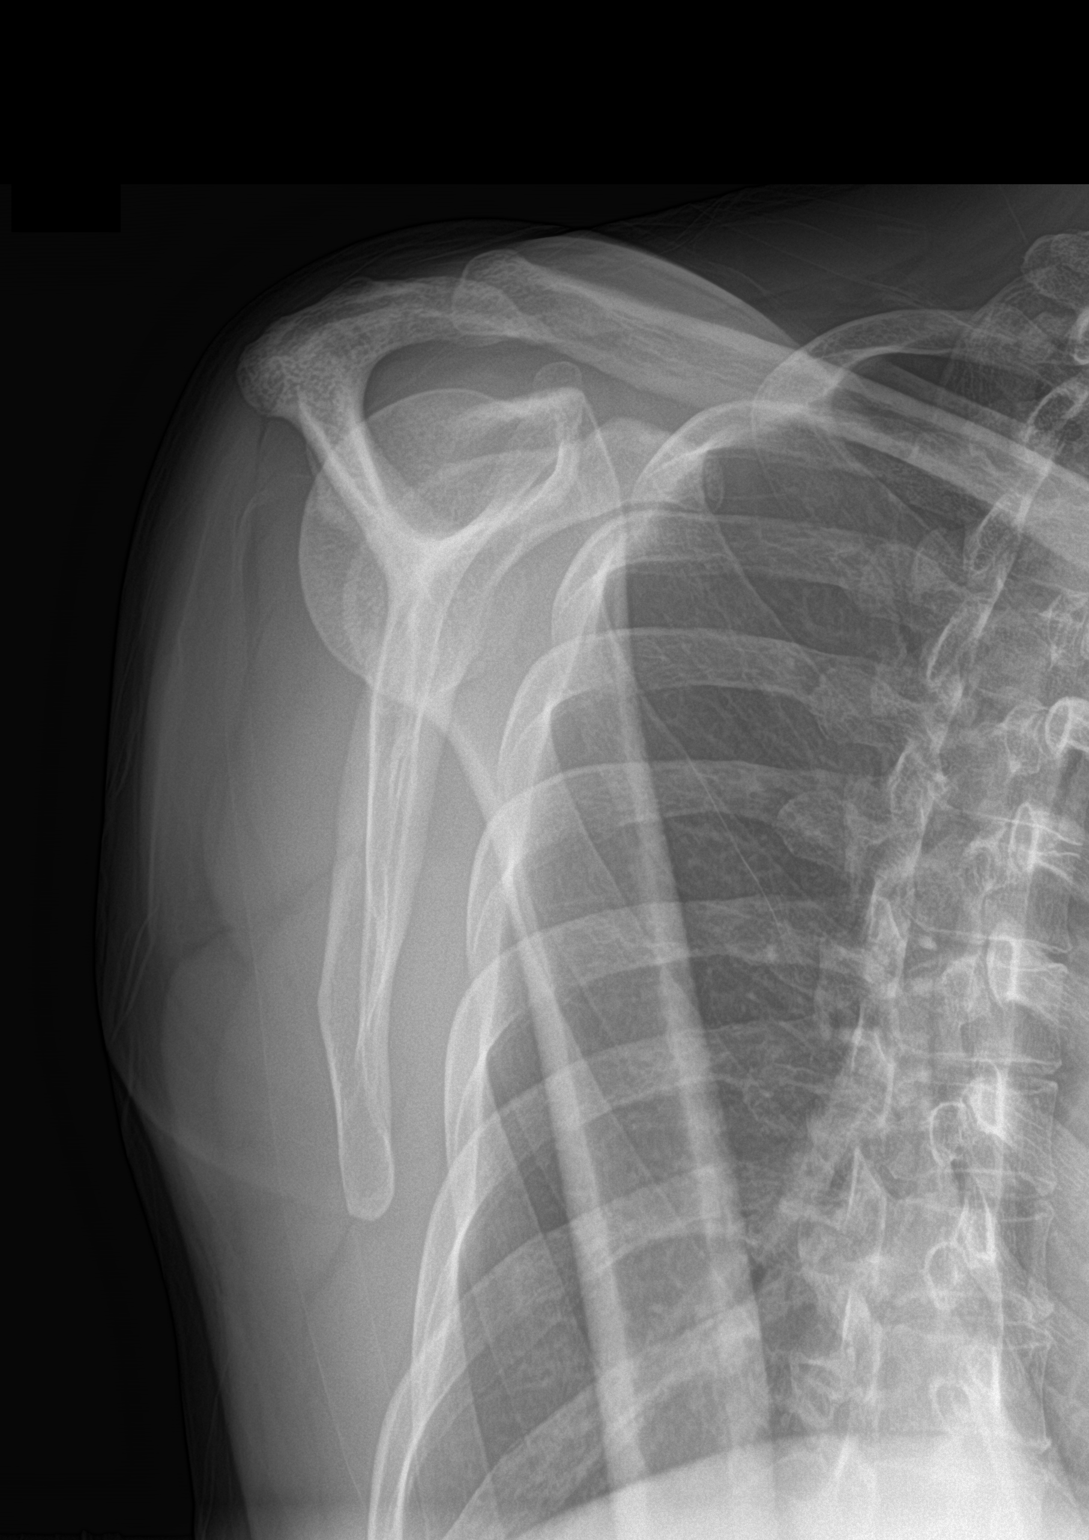

[shoulder axial]
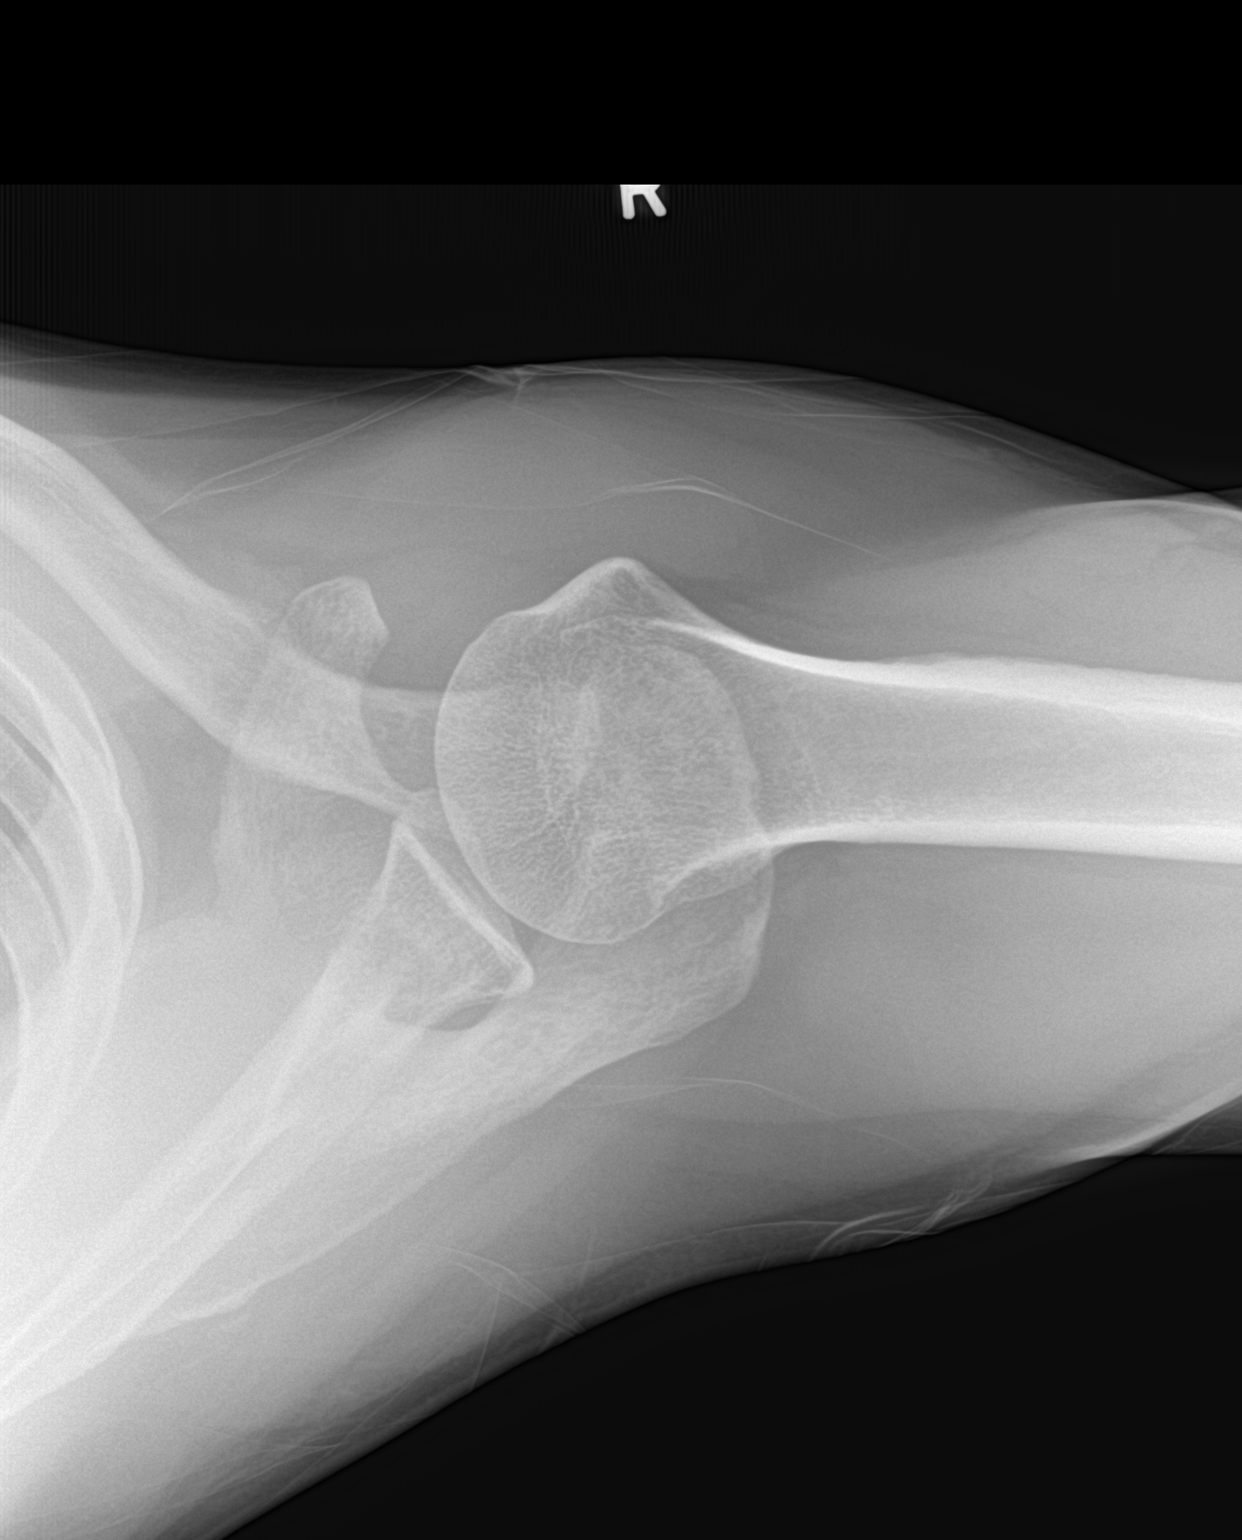

[3 of 3 positions shown; findings below may reference images not displayed]

FINDINGS: There is no evidence of fracture or dislocation. There is no
evidence of arthropathy or other focal bone abnormality. Soft
tissues are unremarkable.
IMPRESSION: No fracture or dislocation of the bilateral shoulders. Joint spaces
are well preserved.

## 2021-01-16 MED ORDER — METHOCARBAMOL 500 MG PO TABS: 500.0000 mg | ORAL_TABLET | Freq: Every evening | ORAL | 0 refills | Status: DC | PRN

## 2021-01-16 MED ORDER — PREDNISONE 10 MG PO TABS: ORAL_TABLET | ORAL | 0 refills | Status: DC
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
## 2021-01-16 NOTE — Patient Instructions (Signed)
Shoulder Exercises Ask your health care provider which exercises are safe for you. Do exercises exactly as told by your health care provider and adjust them as directed. It is normal to feel mild stretching, pulling, tightness, or discomfort as you do these exercises. Stop right away if you feel sudden pain or your pain gets worse. Do not begin these exercises until told by your health care provider. Stretching exercises External rotation and abduction This exercise is sometimes called corner stretch. This exercise rotates your arm outward (external rotation) and moves your arm out from your body (abduction). Stand in a doorway with one of your feet slightly in front of the other. This is called a staggered stance. If you cannot reach your forearms to the door frame, stand facing a corner of a room. Choose one of the following positions as told by your health care provider: Place your hands and forearms on the door frame above your head. Place your hands and forearms on the door frame at the height of your head. Place your hands on the door frame at the height of your elbows. Slowly move your weight onto your front foot until you feel a stretch across your chest and in the front of your shoulders. Keep your head and chest upright and keep your abdominal muscles tight. Hold for __________ seconds. To release the stretch, shift your weight to your back foot. Repeat __________ times. Complete this exercise __________ times a day. Extension, standing Stand and hold a broomstick, a cane, or a similar object behind your back. Your hands should be a little wider than shoulder width apart. Your palms should face away from your back. Keeping your elbows straight and your shoulder muscles relaxed, move the stick away from your body until you feel a stretch in your shoulders (extension). Avoid shrugging your shoulders while you move the stick. Keep your shoulder blades tucked down toward the middle of your  back. Hold for __________ seconds. Slowly return to the starting position. Repeat __________ times. Complete this exercise __________ times a day. Range-of-motion exercises Pendulum  Stand near a wall or a surface that you can hold onto for balance. Bend at the waist and let your left / right arm hang straight down. Use your other arm to support you. Keep your back straight and do not lock your knees. Relax your left / right arm and shoulder muscles, and move your hips and your trunk so your left / right arm swings freely. Your arm should swing because of the motion of your body, not because you are using your arm or shoulder muscles. Keep moving your hips and trunk so your arm swings in the following directions, as told by your health care provider: Side to side. Forward and backward. In clockwise and counterclockwise circles. Continue each motion for __________ seconds, or for as long as told by your health care provider. Slowly return to the starting position. Repeat __________ times. Complete this exercise __________ times a day. Shoulder flexion, standing  Stand and hold a broomstick, a cane, or a similar object. Place your hands a little more than shoulder width apart on the object. Your left / right hand should be palm up, and your other hand should be palm down. Keep your elbow straight and your shoulder muscles relaxed. Push the stick up with your healthy arm to raise your left / right arm in front of your body, and then over your head until you feel a stretch in your shoulder (flexion). Avoid   shrugging your shoulder while you raise your arm. Keep your shoulder blade tucked down toward the middle of your back. Hold for __________ seconds. Slowly return to the starting position. Repeat __________ times. Complete this exercise __________ times a day. Shoulder abduction, standing Stand and hold a broomstick, a cane, or a similar object. Place your hands a little more than shoulder  width apart on the object. Your left / right hand should be palm up, and your other hand should be palm down. Keep your elbow straight and your shoulder muscles relaxed. Push the object across your body toward your left / right side. Raise your left / right arm to the side of your body (abduction) until you feel a stretch in your shoulder. Do not raise your arm above shoulder height unless your health care provider tells you to do that. If directed, raise your arm over your head. Avoid shrugging your shoulder while you raise your arm. Keep your shoulder blade tucked down toward the middle of your back. Hold for __________ seconds. Slowly return to the starting position. Repeat __________ times. Complete this exercise __________ times a day. Internal rotation  Place your left / right hand behind your back, palm up. Use your other hand to dangle an exercise band, a towel, or a similar object over your shoulder. Grasp the band with your left / right hand so you are holding on to both ends. Gently pull up on the band until you feel a stretch in the front of your left / right shoulder. The movement of your arm toward the center of your body is called internal rotation. Avoid shrugging your shoulder while you raise your arm. Keep your shoulder blade tucked down toward the middle of your back. Hold for __________ seconds. Release the stretch by letting go of the band and lowering your hands. Repeat __________ times. Complete this exercise __________ times a day. Strengthening exercises External rotation  Sit in a stable chair without armrests. Secure an exercise band to a stable object at elbow height on your left / right side. Place a soft object, such as a folded towel or a small pillow, between your left / right upper arm and your body to move your elbow about 4 inches (10 cm) away from your side. Hold the end of the exercise band so it is tight and there is no slack. Keeping your elbow pressed  against the soft object, slowly move your forearm out, away from your abdomen (external rotation). Keep your body steady so only your forearm moves. Hold for __________ seconds. Slowly return to the starting position. Repeat __________ times. Complete this exercise __________ times a day. Shoulder abduction  Sit in a stable chair without armrests, or stand up. Hold a __________ weight in your left / right hand, or hold an exercise band with both hands. Start with your arms straight down and your left / right palm facing in, toward your body. Slowly lift your left / right hand out to your side (abduction). Do not lift your hand above shoulder height unless your health care provider tells you that this is safe. Keep your arms straight. Avoid shrugging your shoulder while you do this movement. Keep your shoulder blade tucked down toward the middle of your back. Hold for __________ seconds. Slowly lower your arm, and return to the starting position. Repeat __________ times. Complete this exercise __________ times a day. Shoulder extension Sit in a stable chair without armrests, or stand up. Secure an exercise band   to a stable object in front of you so it is at shoulder height. Hold one end of the exercise band in each hand. Your palms should face each other. Straighten your elbows and lift your hands up to shoulder height. Step back, away from the secured end of the exercise band, until the band is tight and there is no slack. Squeeze your shoulder blades together as you pull your hands down to the sides of your thighs (extension). Stop when your hands are straight down by your sides. Do not let your hands go behind your body. Hold for __________ seconds. Slowly return to the starting position. Repeat __________ times. Complete this exercise __________ times a day. Shoulder row Sit in a stable chair without armrests, or stand up. Secure an exercise band to a stable object in front of you so it  is at waist height. Hold one end of the exercise band in each hand. Position your palms so that your thumbs are facing the ceiling (neutral position). Bend each of your elbows to a 90-degree angle (right angle) and keep your upper arms at your sides. Step back until the band is tight and there is no slack. Slowly pull your elbows back behind you. Hold for __________ seconds. Slowly return to the starting position. Repeat __________ times. Complete this exercise __________ times a day. Shoulder press-ups  Sit in a stable chair that has armrests. Sit upright, with your feet flat on the floor. Put your hands on the armrests so your elbows are bent and your fingers are pointing forward. Your hands should be about even with the sides of your body. Push down on the armrests and use your arms to lift yourself off the chair. Straighten your elbows and lift yourself up as much as you comfortably can. Move your shoulder blades down, and avoid letting your shoulders move up toward your ears. Keep your feet on the ground. As you get stronger, your feet should support less of your body weight as you lift yourself up. Hold for __________ seconds. Slowly lower yourself back into the chair. Repeat __________ times. Complete this exercise __________ times a day. Wall push-ups  Stand so you are facing a stable wall. Your feet should be about one arm-length away from the wall. Lean forward and place your palms on the wall at shoulder height. Keep your feet flat on the floor as you bend your elbows and lean forward toward the wall. Hold for __________ seconds. Straighten your elbows to push yourself back to the starting position. Repeat __________ times. Complete this exercise __________ times a day. This information is not intended to replace advice given to you by your health care provider. Make sure you discuss any questions you have with your healthcare provider. Document Revised: 11/11/2018 Document  Reviewed: 08/19/2018 Elsevier Patient Education  2022 [employer] Inc.  

## 2021-02-14 ENCOUNTER — Other Ambulatory Visit: Payer: Self-pay | Admitting: Internal Medicine

## 2021-02-14 NOTE — Telephone Encounter (Signed)
f shoulder pain and would like refill    Requested Prescriptions  Pending Prescriptions Disp Refills   predniSONE (DELTASONE) 10 MG tablet 21 tablet 0    Sig: Take 6 tabs on day 1, 5 tabs on day 2, 4 tabs on day 3, 3 tabs on day 4, 2 tabs on day 5, 1 tab on day 6      Not Delegated - Endocrinology:  Oral Corticosteroids Failed - 02/14/2021  8:21 AM      Failed - This refill cannot be delegated      Passed - Last BP in normal range    BP Readings from Last 1 Encounters:  01/16/21 117/81          Passed - Valid encounter within last 6 months    Recent Outpatient Visits           4 weeks ago Chronic pain of both shoulders   [facility] [place], Coralie Keens, NP   2 months ago Prediabetes   [facility] [place], Coralie Keens, NP   5 months ago Concussion with loss of consciousness, initial encounter   [place], FNP   1 year ago Trapezius muscle spasm   [place], FNP   1 year ago Routine medical exam   [place], FNP       Future Appointments             In 9 months Baity, Coralie Keens, NP [facility], [facility]

## 2021-02-14 NOTE — Telephone Encounter (Signed)
Medication Refill - Medication: predniSONE (DELTASONE) 10 MG tablet Pt is still having a lot of shoulder pain  Has the patient contacted their pharmacy? No. (Agent: If no, request that the patient contact the pharmacy for the refill.) (Agent: If yes, when and what did the pharmacy advise?)  Preferred Pharmacy (with phone number or street name): [id number] - [place], [address]  [address] [place] [address]  Phone:  [phone]  Fax:  [phone]   Agent: Please be advised that RX refills may take up to 3 business days. We ask that you follow-up with your pharmacy.

## 2021-03-03 ENCOUNTER — Telehealth: Payer: Self-pay

## 2021-03-03 NOTE — Telephone Encounter (Signed)
Copied from [place] [id number]. Topic: General - Inquiry >> Mar 03, 2021 10:05 AM Greggory Keen D wrote: Pt called asking for a refill on the prednisone for his shoulder pain.  He said his shoulder is still hurting.  CB#  [phone]

## 2021-03-03 NOTE — Telephone Encounter (Signed)
Not appropriate, if still hurting he needs to be reevaluated.

## 2021-03-03 NOTE — Telephone Encounter (Signed)
Appt scheduled for Wednesday, August 3rd at 10:30 am

## 2021-03-05 ENCOUNTER — Encounter: Payer: Self-pay | Admitting: Internal Medicine

## 2021-03-05 ENCOUNTER — Ambulatory Visit (INDEPENDENT_AMBULATORY_CARE_PROVIDER_SITE_OTHER): Payer: BLUE CROSS/BLUE SHIELD | Admitting: Internal Medicine

## 2021-03-05 ENCOUNTER — Other Ambulatory Visit: Payer: Self-pay

## 2021-03-05 DIAGNOSIS — M25512 Pain in left shoulder: Secondary | ICD-10-CM | POA: Insufficient documentation

## 2021-03-05 DIAGNOSIS — M25519 Pain in unspecified shoulder: Secondary | ICD-10-CM | POA: Insufficient documentation

## 2021-03-05 DIAGNOSIS — G8929 Other chronic pain: Secondary | ICD-10-CM

## 2021-03-05 DIAGNOSIS — M25511 Pain in right shoulder: Secondary | ICD-10-CM

## 2021-03-05 NOTE — Assessment & Plan Note (Signed)
Persistent Advised him to restart Meloxicam in addition to his Gabapentin Offered referral to PT but he declines at this time We will give him a work note that he should not lift more than 20 pounds for the next 4 weeks Consider referral to orthopedics if symptoms persist or worsen

## 2021-03-05 NOTE — Progress Notes (Signed)
Subjective:    Patient ID: Leonard Henderson, male    DOB: 08/25/1970, 50 y.o.   MRN: [id number]  HPI  Patient presents the clinic today for follow-up of bilateral shoulder pain R>L.  He reports this started 3 months ago.  He describes the pain as sore and achy, worse at night.  The pain radiates into his neck and his upper arms.  He reports some intermittent tingling in his fingertips but reports this is not consistent.  He was seen for the same 01/16/2021.  X-ray of bilateral shoulders was normal at that time.  He was prescribed Prednisone which she took with some relief of symptoms.  He had been taking Meloxicam and Gabapentin prior but is not currently taking the Meloxicam. He is doing a lot of heaving lifting at work and thinks this may be a contributing factor.  Review of Systems  Past Medical History:  Diagnosis Date   Medical history non-contributory     Current Outpatient Medications  Medication Sig Dispense Refill   Cod Liver Oil 1000 MG CAPS Take by mouth.     ergocalciferol (VITAMIN D2) 1.25 MG (50000 UT) capsule Take 1 capsule by mouth once a week.     gabapentin (NEURONTIN) 300 MG capsule gabapentin 300 mg capsule  TAKE 1 CAPSULE BY MOUTH EVERYDAY AT BEDTIME     meloxicam (MOBIC) 15 MG tablet meloxicam 15 mg tablet  TAKE 1 TABLET BY MOUTH EVERY DAY     methocarbamol (ROBAXIN) 500 MG tablet Take 1 tablet (500 mg total) by mouth at bedtime as needed for muscle spasms. 20 tablet 0   predniSONE (DELTASONE) 10 MG tablet Take 6 tabs on day 1, 5 tabs on day 2, 4 tabs on day 3, 3 tabs on day 4, 2 tabs on day 5, 1 tab on day 6 21 tablet 0   No current facility-administered medications for this visit.    No Known Allergies  Family History  Problem Relation Age of Onset   Hypertension Mother    Prostate cancer Father 32   Hypertension Brother    Breast cancer Sister 54    Social History   Socioeconomic History   Marital status: Married    Spouse name: Not on file    Number of children: 3   Years of education: Not on file   Highest education level: High school graduate  Occupational History   Occupation: cabinets    Comment: [employer]  Tobacco Use   Smoking status: Never   Smokeless tobacco: Never  Vaping Use   Vaping Use: Never used  Substance and Sexual Activity   Alcohol use: Yes    Comment: on weekends   Drug use: Yes    Types: Marijuana    Comment: 2x per month over last 10 years   Sexual activity: Yes    Birth control/protection: None  Other Topics Concern   Not on file  Social History Narrative   ** Merged History Encounter **       Social Determinants of Health   Financial Resource Strain: Not on file  Food Insecurity: Not on file  Transportation Needs: Not on file  Physical Activity: Not on file  Stress: Not on file  Social Connections: Not on file  Intimate Partner Violence: Not on file     Constitutional: Denies fever, malaise, fatigue, headache or abrupt weight changes.  Respiratory: Denies difficulty breathing, shortness of breath, cough or sputum production.   Cardiovascular: Denies chest pain, chest tightness,  palpitations or swelling in the hands or feet.  Musculoskeletal: Patient reports chronic bilateral shoulder pain.  Denies decrease in range of motion, difficulty with gait, muscle pain or joint swelling.  Skin: Denies redness, rashes, lesions or ulcercations.  Neurological: Pt reports intermittent tingling in his fingertips. Denies dizziness, difficulty with memory, difficulty with speech or problems with balance and coordination.    No other specific complaints in a complete review of systems (except as listed in HPI above).     Objective:   Physical Exam  BP 119/71 (BP Location: Left Arm, Patient Position: Sitting, Cuff Size: Normal)   Pulse 71   Temp 97.8 F (36.6 C) (Temporal)   Resp 17   Ht 6' (1.829 m)   Wt 149 lb (67.6 kg)   SpO2 99%   BMI 20.21 kg/m   Wt Readings from Last 3 Encounters:   01/16/21 149 lb 3.2 oz (67.7 kg)  11/25/20 152 lb 12.8 oz (69.3 kg)  08/28/20 151 lb 11.2 oz (68.8 kg)    General: Appears his stated age, well developed, well nourished in NAD. Skin: Warm, dry and intact. No rashes, lesions or ulcerations noted. HEENT: Head: normal shape and size; Eyes: sclera white and EOMs intact;  Cardiovascular: Normal rate and rhythm.  Pulmonary/Chest: Normal effort and positive vesicular breath sounds. No respiratory distress. No wheezes, rales or ronchi noted.  Musculoskeletal: Decreased extension of the cervical spine.  Normal flexion, rotation and lateral bending of the cervical spine.  No bony tenderness noted over the cervical spine.  Normal internal and external rotation of bilateral shoulders but slower on the right than the left.  No bony tenderness noted over the shoulders.  Shoulder shrug equal.  Strength 5/5 BUE.  Handgrips equal.  Negative drop can test bilaterally. Neurological: Alert and oriented. Coordination normal.    BMET    Component Value Date/Time   NA 140 08/20/2020 0516   K 4.0 08/20/2020 0516   CL 107 08/20/2020 0516   CO2 23 08/20/2020 0516   GLUCOSE 103 (H) 08/20/2020 0516   BUN 11 08/20/2020 0516   CREATININE 1.13 08/20/2020 0516   CREATININE 0.93 10/05/2019 1036   CALCIUM 8.6 (L) 08/20/2020 0516   GFRNONAA >60 08/20/2020 0516   GFRNONAA 95 10/05/2019 1036   GFRAA 111 10/05/2019 1036    Lipid Panel     Component Value Date/Time   CHOL 240 (H) 10/05/2019 1036   TRIG 157 (H) 10/05/2019 1036   HDL 51 10/05/2019 1036   CHOLHDL 4.7 10/05/2019 1036   LDLCALC 160 (H) 10/05/2019 1036    CBC    Component Value Date/Time   WBC 8.0 08/19/2020 0826   RBC 4.44 08/19/2020 0826   HGB 12.9 (L) 08/19/2020 0826   HCT 41.3 08/19/2020 0826   PLT 213 08/19/2020 0826   MCV 93.0 08/19/2020 0826   MCH 29.1 08/19/2020 0826   MCHC 31.2 08/19/2020 0826   RDW 12.1 08/19/2020 0826   LYMPHSABS 1.8 08/19/2020 0826   MONOABS 0.8 08/19/2020  0826   EOSABS 0.0 08/19/2020 0826   BASOSABS 0.0 08/19/2020 0826    Hgb A1C Lab Results  Component Value Date   HGBA1C 5.7 (H) 10/05/2019            Assessment & Plan:   Webb Silversmith, NP This visit occurred during the SARS-CoV-2 public health emergency.  Safety protocols were in place, including screening questions prior to the visit, additional usage of staff PPE, and extensive cleaning of exam room while  observing appropriate contact time as indicated for disinfecting solutions.

## 2021-03-05 NOTE — Patient Instructions (Signed)
Shoulder Exercises Ask your health care provider which exercises are safe for you. Do exercises exactly as told by your health care provider and adjust them as directed. It is normal to feel mild stretching, pulling, tightness, or discomfort as you do these exercises. Stop right away if you feel sudden pain or your pain gets worse. Do not begin these exercises until told by your health care provider. Stretching exercises External rotation and abduction This exercise is sometimes called corner stretch. This exercise rotates your arm outward (external rotation) and moves your arm out from your body (abduction). Stand in a doorway with one of your feet slightly in front of the other. This is called a staggered stance. If you cannot reach your forearms to the door frame, stand facing a corner of a room. Choose one of the following positions as told by your health care provider: Place your hands and forearms on the door frame above your head. Place your hands and forearms on the door frame at the height of your head. Place your hands on the door frame at the height of your elbows. Slowly move your weight onto your front foot until you feel a stretch across your chest and in the front of your shoulders. Keep your head and chest upright and keep your abdominal muscles tight. Hold for __________ seconds. To release the stretch, shift your weight to your back foot. Repeat __________ times. Complete this exercise __________ times a day. Extension, standing Stand and hold a broomstick, a cane, or a similar object behind your back. Your hands should be a little wider than shoulder width apart. Your palms should face away from your back. Keeping your elbows straight and your shoulder muscles relaxed, move the stick away from your body until you feel a stretch in your shoulders (extension). Avoid shrugging your shoulders while you move the stick. Keep your shoulder blades tucked down toward the middle of your  back. Hold for __________ seconds. Slowly return to the starting position. Repeat __________ times. Complete this exercise __________ times a day. Range-of-motion exercises Pendulum  Stand near a wall or a surface that you can hold onto for balance. Bend at the waist and let your left / right arm hang straight down. Use your other arm to support you. Keep your back straight and do not lock your knees. Relax your left / right arm and shoulder muscles, and move your hips and your trunk so your left / right arm swings freely. Your arm should swing because of the motion of your body, not because you are using your arm or shoulder muscles. Keep moving your hips and trunk so your arm swings in the following directions, as told by your health care provider: Side to side. Forward and backward. In clockwise and counterclockwise circles. Continue each motion for __________ seconds, or for as long as told by your health care provider. Slowly return to the starting position. Repeat __________ times. Complete this exercise __________ times a day. Shoulder flexion, standing  Stand and hold a broomstick, a cane, or a similar object. Place your hands a little more than shoulder width apart on the object. Your left / right hand should be palm up, and your other hand should be palm down. Keep your elbow straight and your shoulder muscles relaxed. Push the stick up with your healthy arm to raise your left / right arm in front of your body, and then over your head until you feel a stretch in your shoulder (flexion). Avoid   shrugging your shoulder while you raise your arm. Keep your shoulder blade tucked down toward the middle of your back. Hold for __________ seconds. Slowly return to the starting position. Repeat __________ times. Complete this exercise __________ times a day. Shoulder abduction, standing Stand and hold a broomstick, a cane, or a similar object. Place your hands a little more than shoulder  width apart on the object. Your left / right hand should be palm up, and your other hand should be palm down. Keep your elbow straight and your shoulder muscles relaxed. Push the object across your body toward your left / right side. Raise your left / right arm to the side of your body (abduction) until you feel a stretch in your shoulder. Do not raise your arm above shoulder height unless your health care provider tells you to do that. If directed, raise your arm over your head. Avoid shrugging your shoulder while you raise your arm. Keep your shoulder blade tucked down toward the middle of your back. Hold for __________ seconds. Slowly return to the starting position. Repeat __________ times. Complete this exercise __________ times a day. Internal rotation  Place your left / right hand behind your back, palm up. Use your other hand to dangle an exercise band, a towel, or a similar object over your shoulder. Grasp the band with your left / right hand so you are holding on to both ends. Gently pull up on the band until you feel a stretch in the front of your left / right shoulder. The movement of your arm toward the center of your body is called internal rotation. Avoid shrugging your shoulder while you raise your arm. Keep your shoulder blade tucked down toward the middle of your back. Hold for __________ seconds. Release the stretch by letting go of the band and lowering your hands. Repeat __________ times. Complete this exercise __________ times a day. Strengthening exercises External rotation  Sit in a stable chair without armrests. Secure an exercise band to a stable object at elbow height on your left / right side. Place a soft object, such as a folded towel or a small pillow, between your left / right upper arm and your body to move your elbow about 4 inches (10 cm) away from your side. Hold the end of the exercise band so it is tight and there is no slack. Keeping your elbow pressed  against the soft object, slowly move your forearm out, away from your abdomen (external rotation). Keep your body steady so only your forearm moves. Hold for __________ seconds. Slowly return to the starting position. Repeat __________ times. Complete this exercise __________ times a day. Shoulder abduction  Sit in a stable chair without armrests, or stand up. Hold a __________ weight in your left / right hand, or hold an exercise band with both hands. Start with your arms straight down and your left / right palm facing in, toward your body. Slowly lift your left / right hand out to your side (abduction). Do not lift your hand above shoulder height unless your health care provider tells you that this is safe. Keep your arms straight. Avoid shrugging your shoulder while you do this movement. Keep your shoulder blade tucked down toward the middle of your back. Hold for __________ seconds. Slowly lower your arm, and return to the starting position. Repeat __________ times. Complete this exercise __________ times a day. Shoulder extension Sit in a stable chair without armrests, or stand up. Secure an exercise band   to a stable object in front of you so it is at shoulder height. Hold one end of the exercise band in each hand. Your palms should face each other. Straighten your elbows and lift your hands up to shoulder height. Step back, away from the secured end of the exercise band, until the band is tight and there is no slack. Squeeze your shoulder blades together as you pull your hands down to the sides of your thighs (extension). Stop when your hands are straight down by your sides. Do not let your hands go behind your body. Hold for __________ seconds. Slowly return to the starting position. Repeat __________ times. Complete this exercise __________ times a day. Shoulder row Sit in a stable chair without armrests, or stand up. Secure an exercise band to a stable object in front of you so it  is at waist height. Hold one end of the exercise band in each hand. Position your palms so that your thumbs are facing the ceiling (neutral position). Bend each of your elbows to a 90-degree angle (right angle) and keep your upper arms at your sides. Step back until the band is tight and there is no slack. Slowly pull your elbows back behind you. Hold for __________ seconds. Slowly return to the starting position. Repeat __________ times. Complete this exercise __________ times a day. Shoulder press-ups  Sit in a stable chair that has armrests. Sit upright, with your feet flat on the floor. Put your hands on the armrests so your elbows are bent and your fingers are pointing forward. Your hands should be about even with the sides of your body. Push down on the armrests and use your arms to lift yourself off the chair. Straighten your elbows and lift yourself up as much as you comfortably can. Move your shoulder blades down, and avoid letting your shoulders move up toward your ears. Keep your feet on the ground. As you get stronger, your feet should support less of your body weight as you lift yourself up. Hold for __________ seconds. Slowly lower yourself back into the chair. Repeat __________ times. Complete this exercise __________ times a day. Wall push-ups  Stand so you are facing a stable wall. Your feet should be about one arm-length away from the wall. Lean forward and place your palms on the wall at shoulder height. Keep your feet flat on the floor as you bend your elbows and lean forward toward the wall. Hold for __________ seconds. Straighten your elbows to push yourself back to the starting position. Repeat __________ times. Complete this exercise __________ times a day. This information is not intended to replace advice given to you by your health care provider. Make sure you discuss any questions you have with your healthcare provider. Document Revised: 11/11/2018 Document  Reviewed: 08/19/2018 Elsevier Patient Education  2022 [employer] Inc.  

## 2021-03-08 ENCOUNTER — Other Ambulatory Visit: Payer: Self-pay | Admitting: Internal Medicine

## 2021-03-08 NOTE — Telephone Encounter (Signed)
Requested medication (s) are due for refill today: yes  Requested medication (s) are on the active medication list: yes  Last refill:  yes  Future visit scheduled: yes in 2023  Notes to clinic:  med not delegated to NT to RF   Requested Prescriptions  Pending Prescriptions Disp Refills   methocarbamol (ROBAXIN) 500 MG tablet [Pharmacy Med Name: METHOCARBAMOL 500 MG TABLET] 20 tablet 0    Sig: Take 1 tablet (500 mg total) by mouth at bedtime as needed for muscle spasms.      Not Delegated - Analgesics:  Muscle Relaxants Failed - 03/08/2021 12:26 PM      Failed - This refill cannot be delegated      Passed - Valid encounter within last 6 months    Recent Outpatient Visits           3 days ago Chronic pain of both shoulders   [facility] [place], [place] W, NP   1 month ago Chronic pain of both shoulders   [facility] [place], Coralie Keens, NP   3 months ago Prediabetes   [facility] [place], Coralie Keens, NP   6 months ago Concussion with loss of consciousness, initial encounter   [place], FNP   1 year ago Trapezius muscle spasm   [facility], Lupita Raider, FNP       Future Appointments             In 8 months Baity, Coralie Keens, NP [facility], [facility]

## 2021-04-01 ENCOUNTER — Other Ambulatory Visit: Payer: Self-pay | Admitting: Internal Medicine

## 2021-04-01 NOTE — Telephone Encounter (Signed)
Requested medication (s) are due for refill today: due 04/10/21  Requested medication (s) are on the active medication list: yes  Last refill:  03/10/21 #20 0 refills  Future visit scheduled: yes   Notes to clinic:  not delegated per protocol     Requested Prescriptions  Pending Prescriptions Disp Refills   methocarbamol (ROBAXIN) 500 MG tablet [Pharmacy Med Name: METHOCARBAMOL 500 MG TABLET] 20 tablet 0    Sig: TAKE 1 TABLET (500 MG TOTAL) BY MOUTH AT BEDTIME AS NEEDED FOR MUSCLE SPASMS.     Not Delegated - Analgesics:  Muscle Relaxants Failed - 04/01/2021 10:10 AM      Failed - This refill cannot be delegated      Passed - Valid encounter within last 6 months    Recent Outpatient Visits           3 weeks ago Chronic pain of both shoulders   [facility] [place], [place], NP   2 months ago Chronic pain of both shoulders   [facility] [place], Coralie Keens, NP   4 months ago Prediabetes   [facility] [place], Coralie Keens, NP   7 months ago Concussion with loss of consciousness, initial encounter   [facility], Lupita Raider, FNP   1 year ago Trapezius muscle spasm   [facility], Lupita Raider, FNP       Future Appointments             In 8 months Baity, Coralie Keens, NP [facility], [facility]

## 2021-05-02 ENCOUNTER — Other Ambulatory Visit: Payer: Self-pay | Admitting: Internal Medicine

## 2021-05-02 NOTE — Telephone Encounter (Signed)
Requested medication (s) are due for refill today: yes  Requested medication (s) are on the active medication list:yes   Last refill: 03/10/21  #20  0 refills  Future visit scheduled  yes 11/27/21  Notes to clinic: not delegated  Requested Prescriptions  Pending Prescriptions Disp Refills   methocarbamol (ROBAXIN) 500 MG tablet [Pharmacy Med Name: METHOCARBAMOL 500 MG TABLET] 20 tablet 0    Sig: TAKE 1 TABLET (500 MG TOTAL) BY MOUTH AT BEDTIME AS NEEDED FOR MUSCLE SPASMS.     Not Delegated - Analgesics:  Muscle Relaxants Failed - 05/02/2021  1:53 PM      Failed - This refill cannot be delegated      Passed - Valid encounter within last 6 months    Recent Outpatient Visits           1 month ago Chronic pain of both shoulders   [facility] [place], [place] W, NP   3 months ago Chronic pain of both shoulders   [facility] [place], Coralie Keens, NP   5 months ago Prediabetes   [facility] [place], Coralie Keens, NP   8 months ago Concussion with loss of consciousness, initial encounter   [facility], Lupita Raider, FNP   1 year ago Trapezius muscle spasm   [facility], Lupita Raider, FNP       Future Appointments             In 6 months Baity, Coralie Keens, NP [facility], [facility]

## 2021-05-21 ENCOUNTER — Other Ambulatory Visit: Payer: Self-pay | Admitting: Internal Medicine

## 2021-05-21 NOTE — Telephone Encounter (Signed)
Requested medications are due for refill today.  yes  Requested medications are on the active medications list.  yes  Last refill. 05/05/2021  Future visit scheduled.   yes  Notes to clinic.  Medication not delegated.

## 2021-05-28 ENCOUNTER — Other Ambulatory Visit: Payer: Self-pay | Admitting: Internal Medicine

## 2021-05-28 NOTE — Telephone Encounter (Signed)
Requested medications are due for refill today.  yes  Requested medications are on the active medications list.  yes  Last refill. 05/05/2021  Future visit scheduled.   yes  Notes to clinic.  Medication not delegated.

## 2021-06-30 ENCOUNTER — Other Ambulatory Visit: Payer: Self-pay | Admitting: Internal Medicine

## 2021-07-01 NOTE — Telephone Encounter (Signed)
Requested medications are due for refill today yes  Requested medications are on the active medication list yes  Last refill 05/05/21  Last visit 03/05/21  Future visit scheduled 11/27/21  Notes to clinic This medication can not be delegated, please assess.  Requested Prescriptions  Pending Prescriptions Disp Refills   methocarbamol (ROBAXIN) 500 MG tablet [Pharmacy Med Name: METHOCARBAMOL 500 MG TABLET] 20 tablet 0    Sig: TAKE 1 TABLET (500 MG TOTAL) BY MOUTH AT BEDTIME AS NEEDED FOR MUSCLE SPASMS.     Not Delegated - Analgesics:  Muscle Relaxants Failed - 06/30/2021  7:30 AM      Failed - This refill cannot be delegated      Passed - Valid encounter within last 6 months    Recent Outpatient Visits           3 months ago Chronic pain of both shoulders   [facility] [place], [place] W, NP   5 months ago Chronic pain of both shoulders   [facility] [place], Coralie Keens, NP   7 months ago Prediabetes   [facility] [place], Coralie Keens, NP   10 months ago Concussion with loss of consciousness, initial encounter   [place], FNP   1 year ago Trapezius muscle spasm   [facility], Lupita Raider, FNP       Future Appointments             In 4 months Baity, Coralie Keens, NP [facility], [facility]

## 2021-08-22 ENCOUNTER — Other Ambulatory Visit: Payer: Self-pay | Admitting: Internal Medicine

## 2021-08-22 NOTE — Telephone Encounter (Signed)
Requested medication (s) are due for refill today: yes  Requested medication (s) are on the active medication list: yes  Last refill:  07/01/21  Future visit scheduled: yes  Notes to clinic:  Unable to refill per protocol, cannot delegate.      Requested Prescriptions  Pending Prescriptions Disp Refills   methocarbamol (ROBAXIN) 500 MG tablet [Pharmacy Med Name: METHOCARBAMOL 500 MG TABLET] 20 tablet 0    Sig: TAKE 1 TABLET (500 MG TOTAL) BY MOUTH AT BEDTIME AS NEEDED FOR MUSCLE SPASMS.     Not Delegated - Analgesics:  Muscle Relaxants Failed - 08/22/2021  2:06 PM      Failed - This refill cannot be delegated      Passed - Valid encounter within last 6 months    Recent Outpatient Visits           5 months ago Chronic pain of both shoulders   [facility] [place], [place], NP   7 months ago Chronic pain of both shoulders   [facility] [place], Coralie Keens, NP   9 months ago Prediabetes   [facility] [place], Coralie Keens, NP   11 months ago Concussion with loss of consciousness, initial encounter   [place], FNP   1 year ago Trapezius muscle spasm   [facility], Lupita Raider, FNP       Future Appointments             In 3 months Baity, Coralie Keens, NP [facility], [facility]

## 2021-09-18 ENCOUNTER — Other Ambulatory Visit: Payer: Self-pay

## 2021-09-18 ENCOUNTER — Ambulatory Visit (INDEPENDENT_AMBULATORY_CARE_PROVIDER_SITE_OTHER): Payer: 59 | Admitting: Internal Medicine

## 2021-09-18 ENCOUNTER — Encounter: Payer: Self-pay | Admitting: Internal Medicine

## 2021-09-18 VITALS — BP 108/76 | HR 78 | Temp 97.6°F | Wt 155.0 lb

## 2021-09-18 DIAGNOSIS — G8929 Other chronic pain: Secondary | ICD-10-CM

## 2021-09-18 DIAGNOSIS — M25511 Pain in right shoulder: Secondary | ICD-10-CM | POA: Diagnosis not present

## 2021-09-18 DIAGNOSIS — M25512 Pain in left shoulder: Secondary | ICD-10-CM

## 2021-09-18 MED ORDER — METHOCARBAMOL 750 MG PO TABS: 750.0000 mg | ORAL_TABLET | Freq: Every evening | ORAL | 0 refills | Status: DC | PRN

## 2021-09-18 NOTE — Patient Instructions (Signed)
Shoulder Exercises Ask your health care provider which exercises are safe for you. Do exercises exactly as told by your health care provider and adjust them as directed. It is normal to feel mild stretching, pulling, tightness, or discomfort as you do these exercises. Stop right away if you feel sudden pain or your pain gets worse. Do not begin these exercises until told by your health care provider. Stretching exercises External rotation and abduction This exercise is sometimes called corner stretch. This exercise rotates your arm outward (external rotation) and moves your arm out from your body (abduction). Stand in a doorway with one of your feet slightly in front of the other. This is called a staggered stance. If you cannot reach your forearms to the door frame, stand facing a corner of a room. Choose one of the following positions as told by your health care provider: Place your hands and forearms on the door frame above your head. Place your hands and forearms on the door frame at the height of your head. Place your hands on the door frame at the height of your elbows. Slowly move your weight onto your front foot until you feel a stretch across your chest and in the front of your shoulders. Keep your head and chest upright and keep your abdominal muscles tight. Hold for __________ seconds. To release the stretch, shift your weight to your back foot. Repeat __________ times. Complete this exercise __________ times a day. Extension, standing Stand and hold a broomstick, a cane, or a similar object behind your back. Your hands should be a little wider than shoulder width apart. Your palms should face away from your back. Keeping your elbows straight and your shoulder muscles relaxed, move the stick away from your body until you feel a stretch in your shoulders (extension). Avoid shrugging your shoulders while you move the stick. Keep your shoulder blades tucked down toward the middle of your  back. Hold for __________ seconds. Slowly return to the starting position. Repeat __________ times. Complete this exercise __________ times a day. Range-of-motion exercises Pendulum  Stand near a wall or a surface that you can hold onto for balance. Bend at the waist and let your left / right arm hang straight down. Use your other arm to support you. Keep your back straight and do not lock your knees. Relax your left / right arm and shoulder muscles, and move your hips and your trunk so your left / right arm swings freely. Your arm should swing because of the motion of your body, not because you are using your arm or shoulder muscles. Keep moving your hips and trunk so your arm swings in the following directions, as told by your health care provider: Side to side. Forward and backward. In clockwise and counterclockwise circles. Continue each motion for __________ seconds, or for as long as told by your health care provider. Slowly return to the starting position. Repeat __________ times. Complete this exercise __________ times a day. Shoulder flexion, standing  Stand and hold a broomstick, a cane, or a similar object. Place your hands a little more than shoulder width apart on the object. Your left / right hand should be palm up, and your other hand should be palm down. Keep your elbow straight and your shoulder muscles relaxed. Push the stick up with your healthy arm to raise your left / right arm in front of your body, and then over your head until you feel a stretch in your shoulder (flexion). Avoid   shrugging your shoulder while you raise your arm. Keep your shoulder blade tucked down toward the middle of your back. Hold for __________ seconds. Slowly return to the starting position. Repeat __________ times. Complete this exercise __________ times a day. Shoulder abduction, standing Stand and hold a broomstick, a cane, or a similar object. Place your hands a little more than shoulder  width apart on the object. Your left / right hand should be palm up, and your other hand should be palm down. Keep your elbow straight and your shoulder muscles relaxed. Push the object across your body toward your left / right side. Raise your left / right arm to the side of your body (abduction) until you feel a stretch in your shoulder. Do not raise your arm above shoulder height unless your health care provider tells you to do that. If directed, raise your arm over your head. Avoid shrugging your shoulder while you raise your arm. Keep your shoulder blade tucked down toward the middle of your back. Hold for __________ seconds. Slowly return to the starting position. Repeat __________ times. Complete this exercise __________ times a day. Internal rotation  Place your left / right hand behind your back, palm up. Use your other hand to dangle an exercise band, a towel, or a similar object over your shoulder. Grasp the band with your left / right hand so you are holding on to both ends. Gently pull up on the band until you feel a stretch in the front of your left / right shoulder. The movement of your arm toward the center of your body is called internal rotation. Avoid shrugging your shoulder while you raise your arm. Keep your shoulder blade tucked down toward the middle of your back. Hold for __________ seconds. Release the stretch by letting go of the band and lowering your hands. Repeat __________ times. Complete this exercise __________ times a day. Strengthening exercises External rotation  Sit in a stable chair without armrests. Secure an exercise band to a stable object at elbow height on your left / right side. Place a soft object, such as a folded towel or a small pillow, between your left / right upper arm and your body to move your elbow about 4 inches (10 cm) away from your side. Hold the end of the exercise band so it is tight and there is no slack. Keeping your elbow pressed  against the soft object, slowly move your forearm out, away from your abdomen (external rotation). Keep your body steady so only your forearm moves. Hold for __________ seconds. Slowly return to the starting position. Repeat __________ times. Complete this exercise __________ times a day. Shoulder abduction  Sit in a stable chair without armrests, or stand up. Hold a __________ weight in your left / right hand, or hold an exercise band with both hands. Start with your arms straight down and your left / right palm facing in, toward your body. Slowly lift your left / right hand out to your side (abduction). Do not lift your hand above shoulder height unless your health care provider tells you that this is safe. Keep your arms straight. Avoid shrugging your shoulder while you do this movement. Keep your shoulder blade tucked down toward the middle of your back. Hold for __________ seconds. Slowly lower your arm, and return to the starting position. Repeat __________ times. Complete this exercise __________ times a day. Shoulder extension Sit in a stable chair without armrests, or stand up. Secure an exercise band   to a stable object in front of you so it is at shoulder height. Hold one end of the exercise band in each hand. Your palms should face each other. Straighten your elbows and lift your hands up to shoulder height. Step back, away from the secured end of the exercise band, until the band is tight and there is no slack. Squeeze your shoulder blades together as you pull your hands down to the sides of your thighs (extension). Stop when your hands are straight down by your sides. Do not let your hands go behind your body. Hold for __________ seconds. Slowly return to the starting position. Repeat __________ times. Complete this exercise __________ times a day. Shoulder row Sit in a stable chair without armrests, or stand up. Secure an exercise band to a stable object in front of you so it  is at waist height. Hold one end of the exercise band in each hand. Position your palms so that your thumbs are facing the ceiling (neutral position). Bend each of your elbows to a 90-degree angle (right angle) and keep your upper arms at your sides. Step back until the band is tight and there is no slack. Slowly pull your elbows back behind you. Hold for __________ seconds. Slowly return to the starting position. Repeat __________ times. Complete this exercise __________ times a day. Shoulder press-ups  Sit in a stable chair that has armrests. Sit upright, with your feet flat on the floor. Put your hands on the armrests so your elbows are bent and your fingers are pointing forward. Your hands should be about even with the sides of your body. Push down on the armrests and use your arms to lift yourself off the chair. Straighten your elbows and lift yourself up as much as you comfortably can. Move your shoulder blades down, and avoid letting your shoulders move up toward your ears. Keep your feet on the ground. As you get stronger, your feet should support less of your body weight as you lift yourself up. Hold for __________ seconds. Slowly lower yourself back into the chair. Repeat __________ times. Complete this exercise __________ times a day. Wall push-ups  Stand so you are facing a stable wall. Your feet should be about one arm-length away from the wall. Lean forward and place your palms on the wall at shoulder height. Keep your feet flat on the floor as you bend your elbows and lean forward toward the wall. Hold for __________ seconds. Straighten your elbows to push yourself back to the starting position. Repeat __________ times. Complete this exercise __________ times a day. This information is not intended to replace advice given to you by your health care provider. Make sure you discuss any questions you have with your healthcare provider. Document Revised: 11/11/2018 Document  Reviewed: 08/19/2018 Elsevier Patient Education  2022 [employer] Inc.  

## 2021-09-18 NOTE — Progress Notes (Signed)
Subjective:    Patient ID: Leonard Henderson, male    DOB: [birth year], 51 y.o.   MRN: [id number]  HPI  Pt presents to the clinic with c/o shoulder pain. This is a chronic issue that has worsened in the last month. It is intermittent. He describes the pain as sharp. He denies neck pain, numbness, tingling in his upper extremities. The pain seems worse at night, he can not get comfortable.  He takes Methocarbamol and Gabapentin as prescribed. Xray of bilateral shoulders from 01/2021 showed:  IMPRESSION:  No fracture or dislocation of the bilateral shoulders. Joint spaces are well preserved  He has seen PT in the past with no improvement in symptoms.  Review of Systems     Past Medical History:  Diagnosis Date   Medical history non-contributory     Current Outpatient Medications  Medication Sig Dispense Refill   Cod Liver Oil 1000 MG CAPS Take by mouth.     ergocalciferol (VITAMIN D2) 1.25 MG (50000 UT) capsule Take 1 capsule by mouth once a week.     gabapentin (NEURONTIN) 300 MG capsule gabapentin 300 mg capsule  TAKE 1 CAPSULE BY MOUTH EVERYDAY AT BEDTIME     meloxicam (MOBIC) 15 MG tablet meloxicam 15 mg tablet  TAKE 1 TABLET BY MOUTH EVERY DAY (Patient not taking: Reported on 03/05/2021)     methocarbamol (ROBAXIN) 500 MG tablet TAKE 1 TABLET (500 MG TOTAL) BY MOUTH AT BEDTIME AS NEEDED FOR MUSCLE SPASMS. 20 tablet 0   No current facility-administered medications for this visit.    No Known Allergies  Family History  Problem Relation Age of Onset   Hypertension Mother    Prostate cancer Father 79   Hypertension Brother    Breast cancer Sister 47    Social History   Socioeconomic History   Marital status: Married    Spouse name: Not on file   Number of children: 3   Years of education: Not on file   Highest education level: High school graduate  Occupational History   Occupation: cabinets    Comment: [employer]  Tobacco Use   Smoking status: Never   Smokeless  tobacco: Never  Vaping Use   Vaping Use: Never used  Substance and Sexual Activity   Alcohol use: Not Currently    Comment: on weekends   Drug use: Yes    Types: Marijuana    Comment: 2x per month over last 10 years   Sexual activity: Yes    Birth control/protection: None  Other Topics Concern   Not on file  Social History Narrative   ** Merged History Encounter **       Social Determinants of Health   Financial Resource Strain: Not on file  Food Insecurity: Not on file  Transportation Needs: Not on file  Physical Activity: Not on file  Stress: Not on file  Social Connections: Not on file  Intimate Partner Violence: Not on file     Constitutional: Denies fever, malaise, fatigue, headache or abrupt weight changes.  Respiratory: Denies difficulty breathing, shortness of breath, cough or sputum production.   Cardiovascular: Denies chest pain, chest tightness, palpitations or swelling in the hands or feet.  Gastrointestinal: Denies abdominal pain, bloating, constipation, diarrhea or blood in the stool.  Musculoskeletal: Patient reports bilateral shoulder pain.  Denies decrease in range of motion, difficulty with gait, muscle pain or joint swelling.  Skin: Denies redness, rashes, lesions or ulcercations.  Neurological: Denies numbness, tingling, weakness or problems  with balance and coordination.    No other specific complaints in a complete review of systems (except as listed in HPI above).  Objective:   Physical Exam BP 108/76 (BP Location: Right Arm, Patient Position: Sitting, Cuff Size: Normal)    Pulse 78    Temp 97.6 F (36.4 C) (Temporal)    Wt 155 lb (70.3 kg)    SpO2 98%    BMI 21.02 kg/m   Wt Readings from Last 3 Encounters:  03/05/21 149 lb (67.6 kg)  01/16/21 149 lb 3.2 oz (67.7 kg)  11/25/20 152 lb 12.8 oz (69.3 kg)    General: Appears his stated age, well developed, well nourished in NAD. Skin: Warm, dry and intact. No rashes noted. Cardiovascular:  Normal rate and rhythm. S1,S2 noted.  No murmur, rubs or gallops noted. Radial pulses 2+ bilaterally. Pulmonary/Chest: Normal effort and positive vesicular breath sounds. No respiratory distress. No wheezes, rales or ronchi noted.  Musculoskeletal:Slightly decreased internal and external rotation of the shoulders. No pain with palpation of the shoulders. Positive drop can test bilaterally. Strength 5/5 BUE. Hand grips equal. No difficulty with gait.  Neurological: Alert and oriented. Coordination normal.     BMET    Component Value Date/Time   NA 140 08/20/2020 0516   K 4.0 08/20/2020 0516   CL 107 08/20/2020 0516   CO2 23 08/20/2020 0516   GLUCOSE 103 (H) 08/20/2020 0516   BUN 11 08/20/2020 0516   CREATININE 1.13 08/20/2020 0516   CREATININE 0.93 10/05/2019 1036   CALCIUM 8.6 (L) 08/20/2020 0516   GFRNONAA >60 08/20/2020 0516   GFRNONAA 95 10/05/2019 1036   GFRAA 111 10/05/2019 1036    Lipid Panel     Component Value Date/Time   CHOL 240 (H) 10/05/2019 1036   TRIG 157 (H) 10/05/2019 1036   HDL 51 10/05/2019 1036   CHOLHDL 4.7 10/05/2019 1036   LDLCALC 160 (H) 10/05/2019 1036    CBC    Component Value Date/Time   WBC 8.0 08/19/2020 0826   RBC 4.44 08/19/2020 0826   HGB 12.9 (L) 08/19/2020 0826   HCT 41.3 08/19/2020 0826   PLT 213 08/19/2020 0826   MCV 93.0 08/19/2020 0826   MCH 29.1 08/19/2020 0826   MCHC 31.2 08/19/2020 0826   RDW 12.1 08/19/2020 0826   LYMPHSABS 1.8 08/19/2020 0826   MONOABS 0.8 08/19/2020 0826   EOSABS 0.0 08/19/2020 0826   BASOSABS 0.0 08/19/2020 0826    Hgb A1C Lab Results  Component Value Date   HGBA1C 5.7 (H) 10/05/2019           Assessment & Plan:    Webb Silversmith, NP This visit occurred during the SARS-CoV-2 public health emergency.  Safety protocols were in place, including screening questions prior to the visit, additional usage of staff PPE, and extensive cleaning of exam room while observing appropriate contact time as  indicated for disinfecting solutions.

## 2021-09-18 NOTE — Assessment & Plan Note (Signed)
Deteriorated Will obtain MRI bilateral shoulders Start Tylenol 650 mg prior to bedtime Increase Gabapentin to 900 mg at bedtime Increase Methocarbamol to 750 mg at bedtime If MRI denied, will refer to ortho as he has already completed PT

## 2021-09-24 ENCOUNTER — Telehealth: Payer: Self-pay

## 2021-09-24 NOTE — Telephone Encounter (Signed)
Copied from [name] [phone]. Topic: General - Other >> Sep 24, 2021 12:16 PM Leonard Henderson A wrote: Reason for CRM: The patient would like to speak with a member of staff when possible  The patient says they were directed to get an MRI but have heard no other information about the imaging  Please contact further

## 2021-09-24 NOTE — Telephone Encounter (Signed)
Advised pt that the order has been placed and it looks like they are waiting on approval from insurance.   Thanks,   -Mickel Baas

## 2021-10-16 ENCOUNTER — Ambulatory Visit
Admission: RE | Admit: 2021-10-16 | Discharge: 2021-10-16 | Disposition: A | Discharge status: Home / Self Care | Payer: 59 | Source: Ambulatory Visit | Attending: Internal Medicine | Admitting: Internal Medicine

## 2021-10-16 ENCOUNTER — Other Ambulatory Visit: Payer: Self-pay

## 2021-10-16 DIAGNOSIS — G8929 Other chronic pain: Secondary | ICD-10-CM

## 2021-10-16 DIAGNOSIS — M25512 Pain in left shoulder: Secondary | ICD-10-CM | POA: Insufficient documentation

## 2021-10-16 DIAGNOSIS — M25511 Pain in right shoulder: Secondary | ICD-10-CM | POA: Diagnosis present

## 2021-10-16 IMAGING — MR MR SHOULDER*L* W/O CM
4 of 5 series · 31 of 40 positions shown · non-contrast
Comparison: Left shoulder radiographs [DATE]

CLINICAL DATA: Bilateral shoulder pain radiates into neck for 1 +
years related to heavy lifting at work. Clinical concern for rotator
cuff disorder.

EXAM:
MRI OF THE LEFT SHOULDER WITHOUT CONTRAST
TECHNIQUE: Multiplanar, multisequence MR imaging of the shoulder was performed.
No intravenous contrast was administered.

[Series 5: T2 fat-sat · axial · left · 4.0mm · 0.44mm/px · z∈[-71,+49]mm · 8 of 26 slices shown (1 of 3)]
[im 1/26]
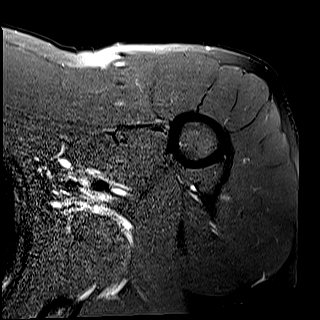
[im 4/26]
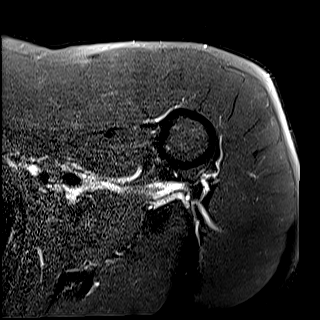
[im 8/26]
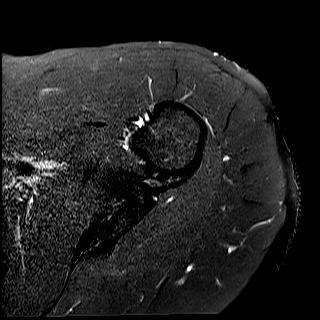
[im 11/26]
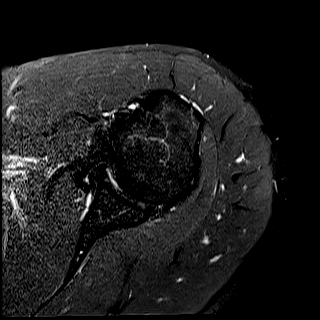
[im 15/26]
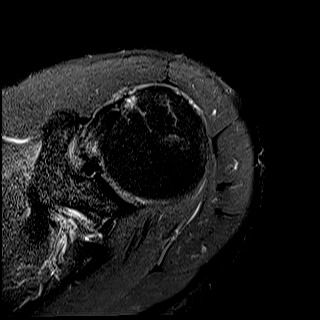
[im 18/26]
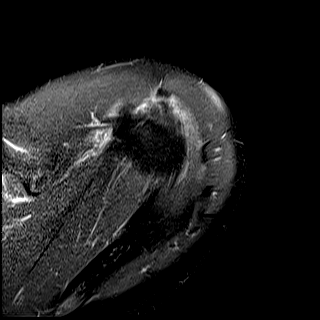
[im 22/26]
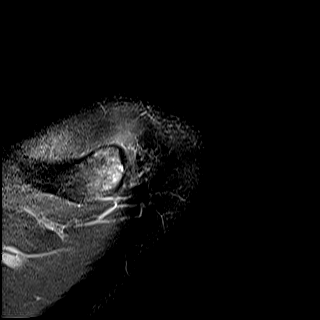
[im 26/26]
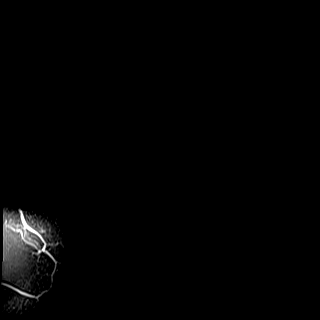

[Series 6: PD · oblique · left · 4.0mm · 0.44mm/px · 9 of 26 slices shown]
[im 1/26]
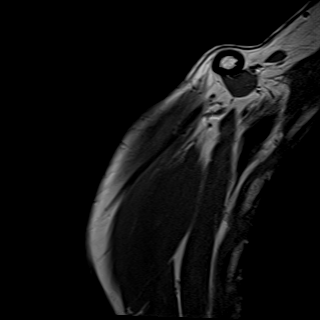
[im 4/26]
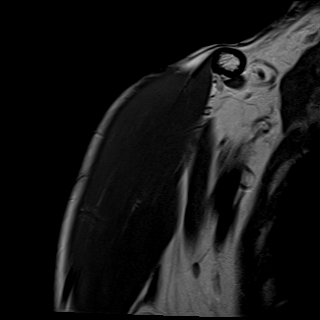
[im 7/26]
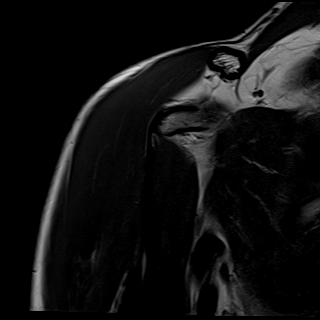
[im 10/26]
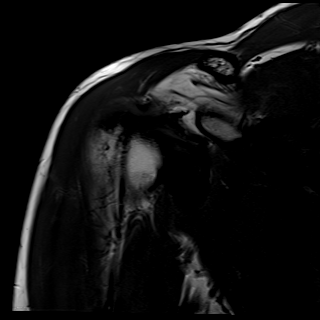
[im 13/26]
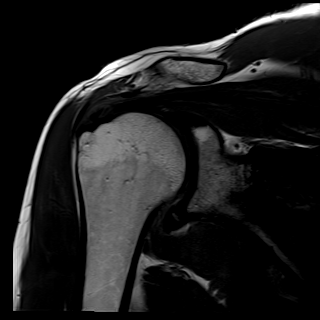
[im 16/26]
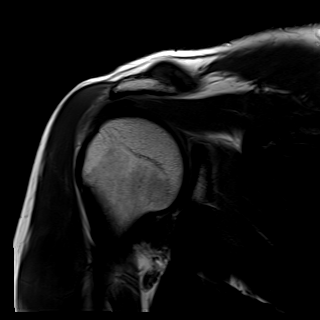
[im 19/26]
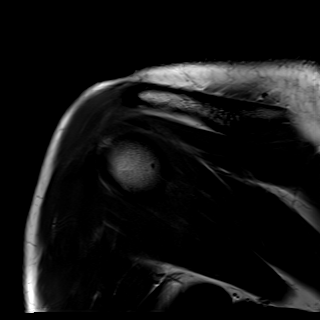
[im 22/26]
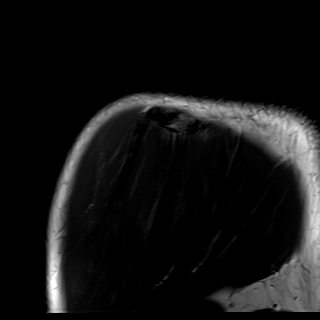
[im 26/26]
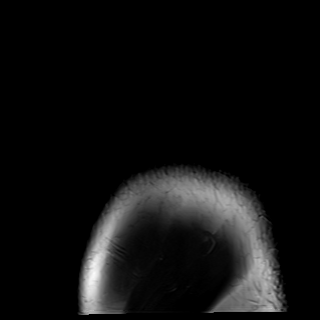

[Series 7: T2 fat-sat · oblique · left · 4.0mm · 0.44mm/px · 9 of 26 slices shown (2 of 3)]
[im 1/26]
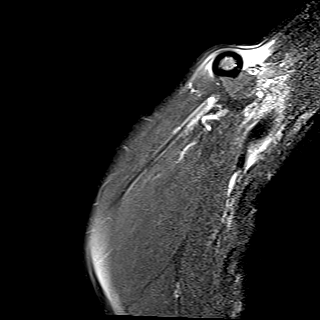
[im 4/26]
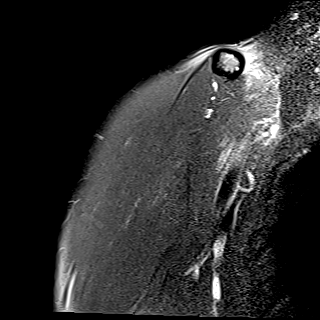
[im 7/26]
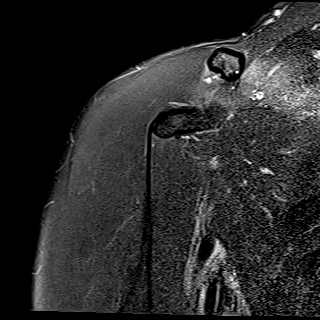
[im 10/26]
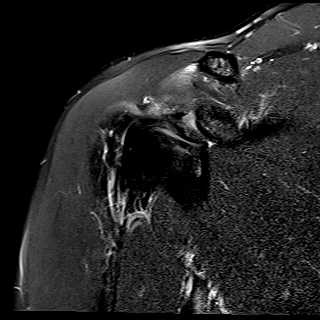
[im 13/26]
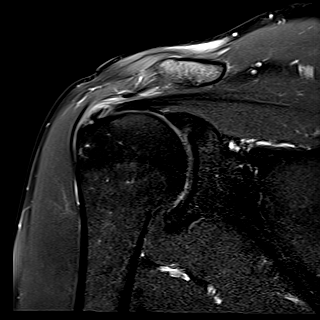
[im 16/26]
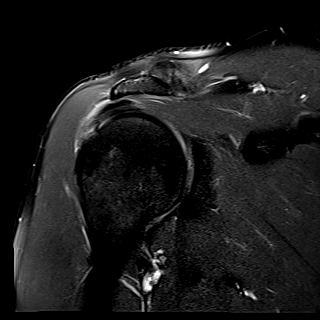
[im 19/26]
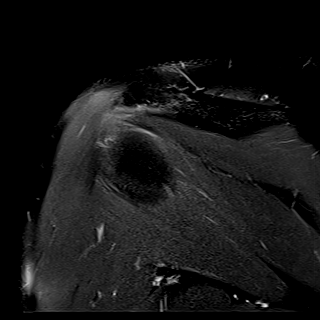
[im 22/26]
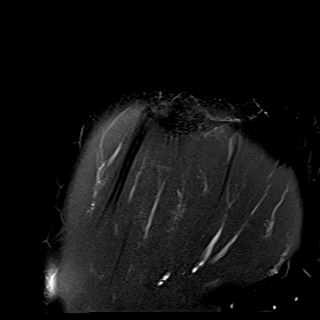
[im 26/26]
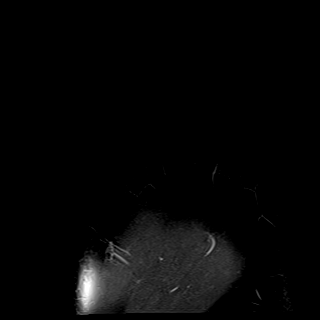

[Series 8: T2 fat-sat · oblique · left · 4.0mm · 0.22mm/px · 5 of 22 slices shown (3 of 3)]
[im 1/22]
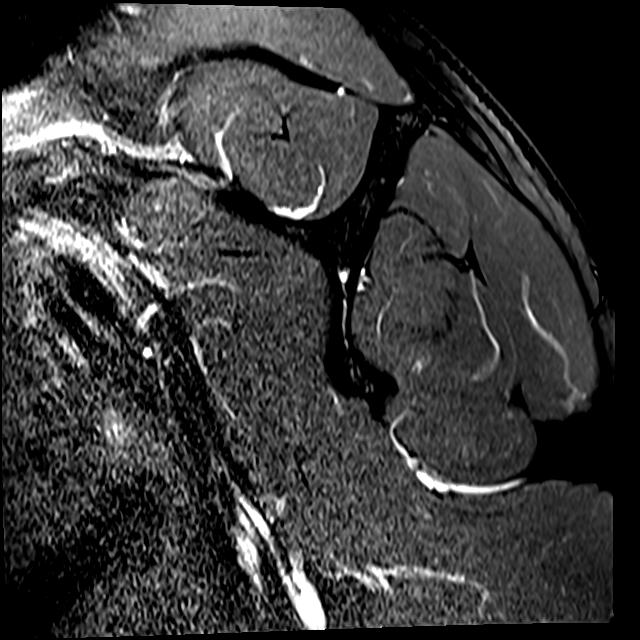
[im 4/22]
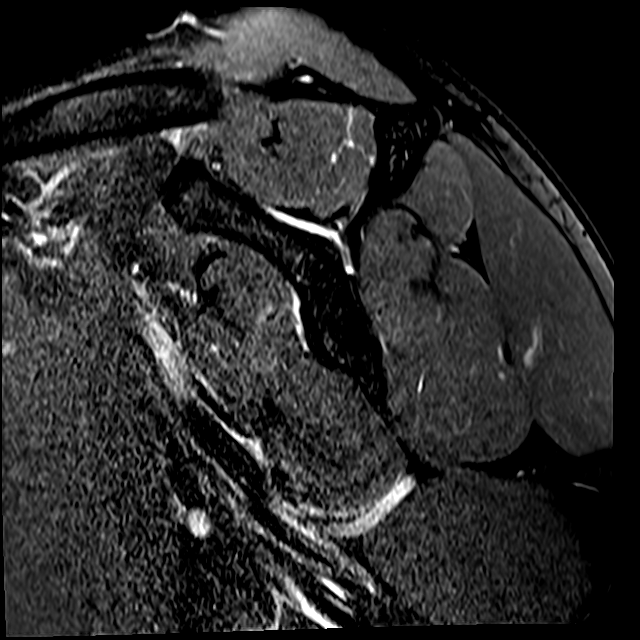
[im 8/22]
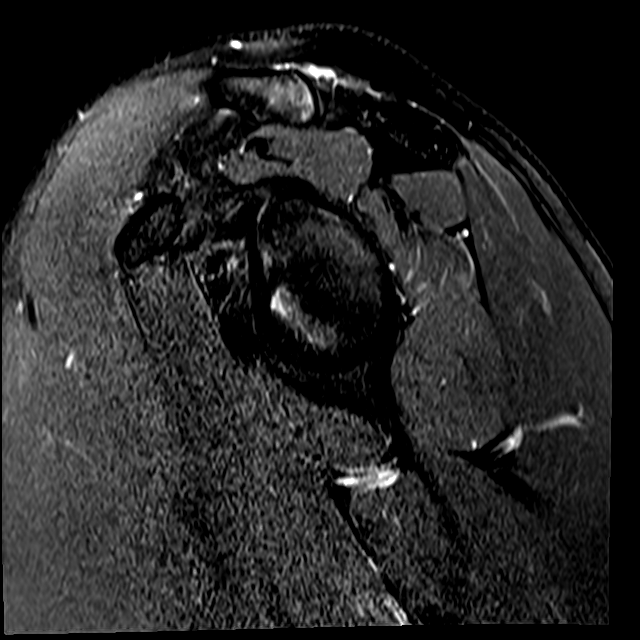
[im 11/22]
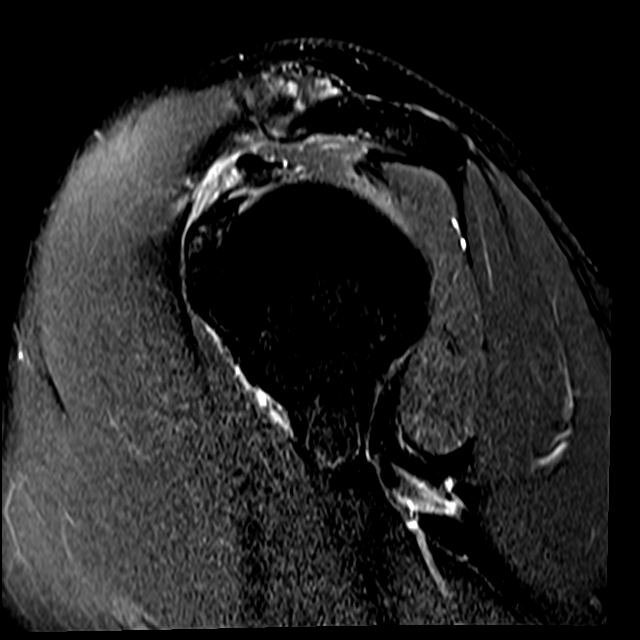
[im 18/22]
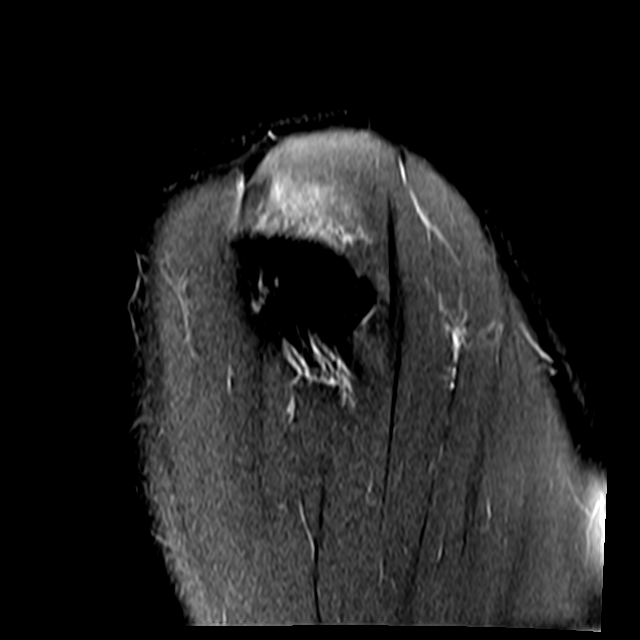

[31 of 40 positions shown; findings below may reference images not displayed]

FINDINGS: Rotator cuff: There is moderate diffuse supraspinatus and mid and
anterior infraspinatus intermediate T2 signal tendinosisa. Minimal
attenuation of the bursal aspect of the slightly anterior
supraspinatus tendon footprint (sagittal image 8 and coronal image
12), mild partial-thickness tearing. There are 2 thin linear
increased signal possible midsubstance tears within the mid AP
dimension of the supraspinatus tendon footprint (coronal image 13).
The subscapularis and teres minor are intact.

Muscles: No rotator cuff muscle atrophy, fatty infiltration, or
edema.

Biceps long head: The intra-articular long head of the biceps tendon
is intact.

Acromioclavicular Joint: There are mild degenerative changes of the
acromioclavicular joint including joint space narrowing, subchondral
marrow edema, and peripheral osteophytosis. Type II acromion. No
subacromial/subdeltoid bursitis.

Glenohumeral Joint: Mild thinning of the glenoid and humeral head
cartilage.

Labrum: Grossly intact, but evaluation is limited by lack of
intraarticular fluid.

Bones:  No acute fracture.

Other: None.
IMPRESSION: :
IMPRESSION: 1. Moderate supraspinatus and mid and anterior infraspinatus
tendinosis.
2. Minimal partial-thickness bursal sided tear of the slightly
anterior supraspinatus tendon. Additional thin linear possible
midsubstance tears within the tendon fibers just posterior to this.
No tendon retraction.
3. Mild degenerative changes of the acromioclavicular joint
including clavicular head marrow edema that may represent a source
of pain.

## 2021-10-16 IMAGING — MR MR SHOULDER*R* W/O CM
4 of 5 series · 31 of 40 positions shown · non-contrast
Comparison: X-ray shoulder [DATE].

CLINICAL DATA: Bilateral shoulder pain radiates into neck for 1 +
years related to heavy lifting at work. Clinical concern for rotator
cuff disorder.

EXAM:
MRI OF THE RIGHT SHOULDER WITHOUT CONTRAST
TECHNIQUE: Multiplanar, multisequence MR imaging of the shoulder was performed.
No intravenous contrast was administered.

[Series 5: T2 fat-sat · axial · right · 4.0mm · 0.44mm/px · z∈[-44,+76]mm · 8 of 26 slices shown (1 of 3)]
[im 1/26]
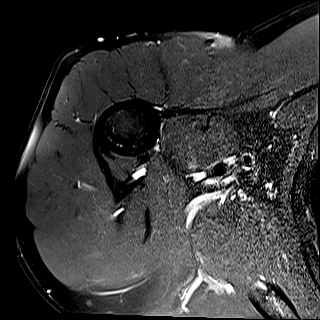
[im 4/26]
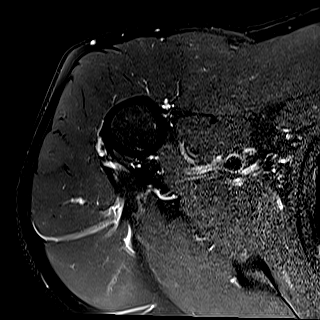
[im 8/26]
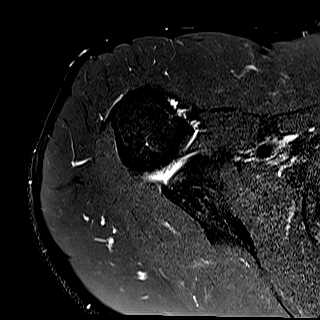
[im 11/26]
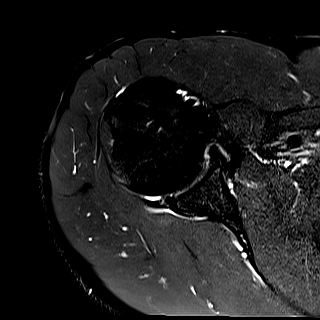
[im 15/26]
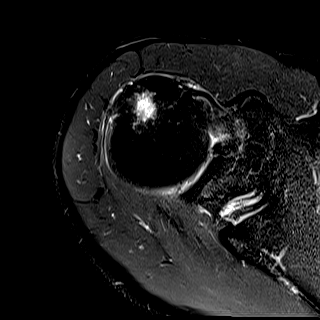
[im 18/26]
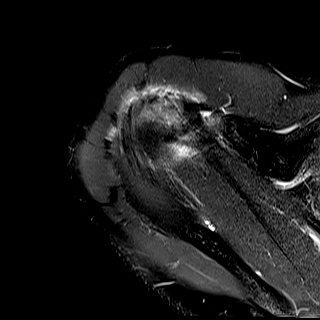
[im 22/26]
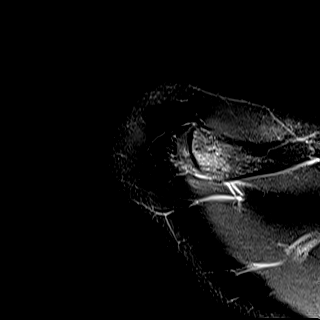
[im 26/26]
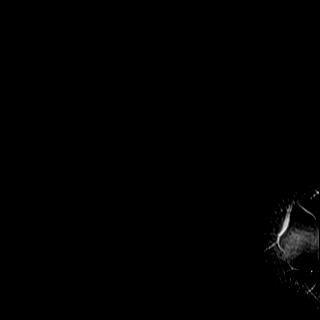

[Series 6: PD · oblique · right · 4.0mm · 0.44mm/px · 9 of 26 slices shown]
[im 1/26]
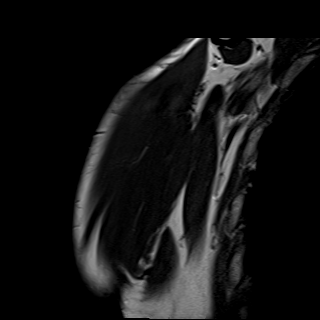
[im 4/26]
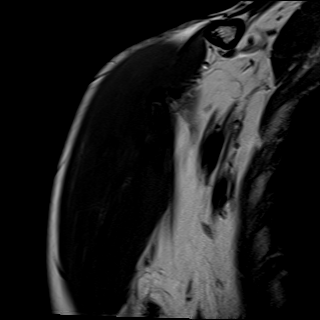
[im 7/26]
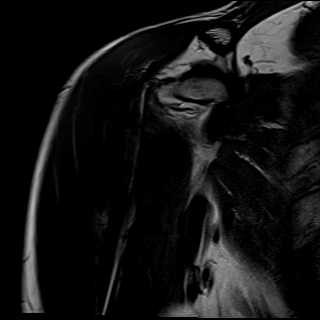
[im 10/26]
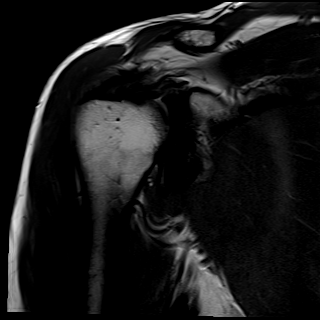
[im 13/26]
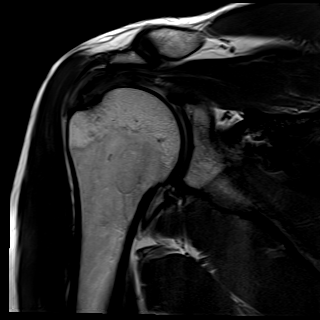
[im 16/26]
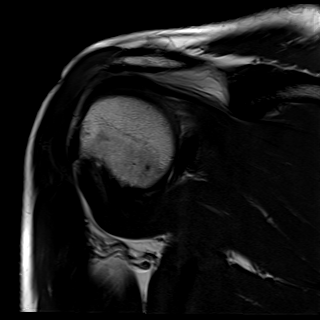
[im 19/26]
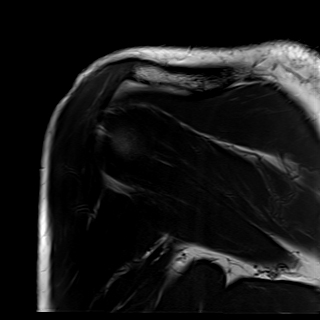
[im 22/26]
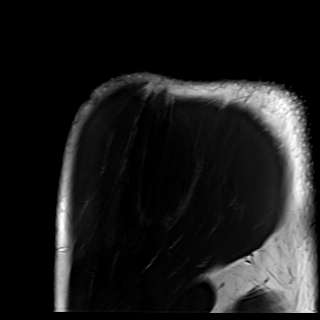
[im 26/26]
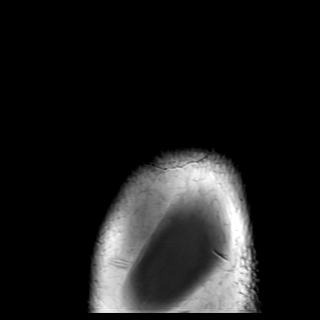

[Series 7: T2 fat-sat · oblique · right · 4.0mm · 0.44mm/px · 9 of 26 slices shown (2 of 3)]
[im 1/26]
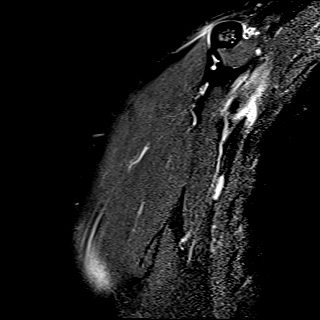
[im 4/26]
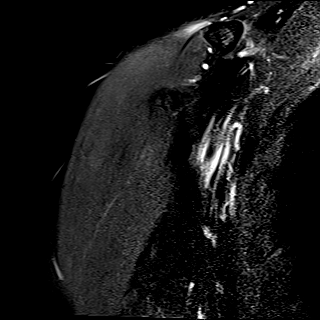
[im 7/26]
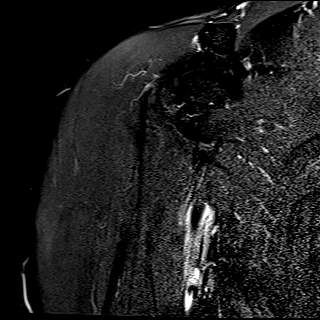
[im 10/26]
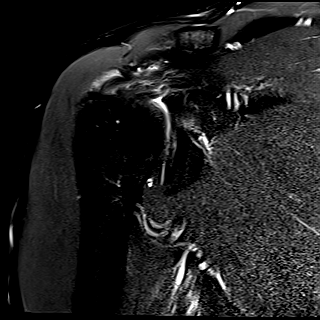
[im 13/26]
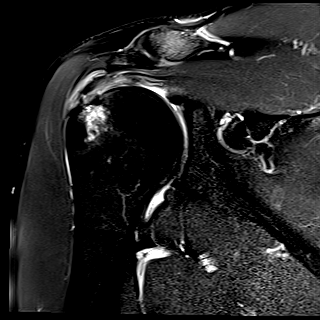
[im 16/26]
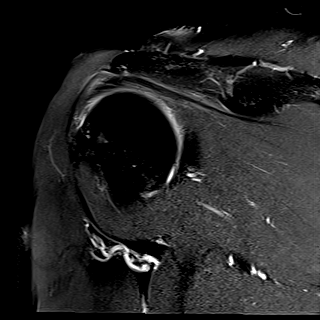
[im 19/26]
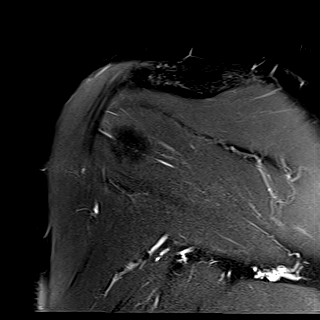
[im 22/26]
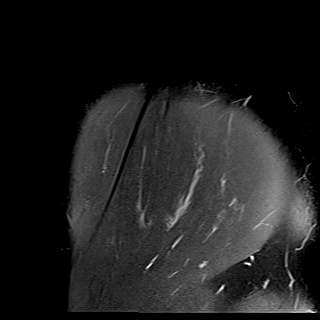
[im 26/26]
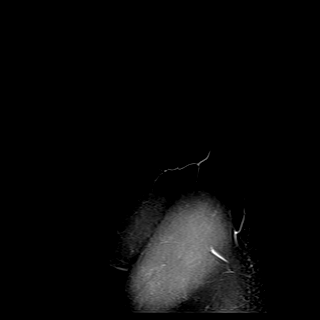

[Series 8: T2 fat-sat · coronal · right · 4.0mm · 0.22mm/px · 5 of 22 slices shown (3 of 3)]
[im 1/22]
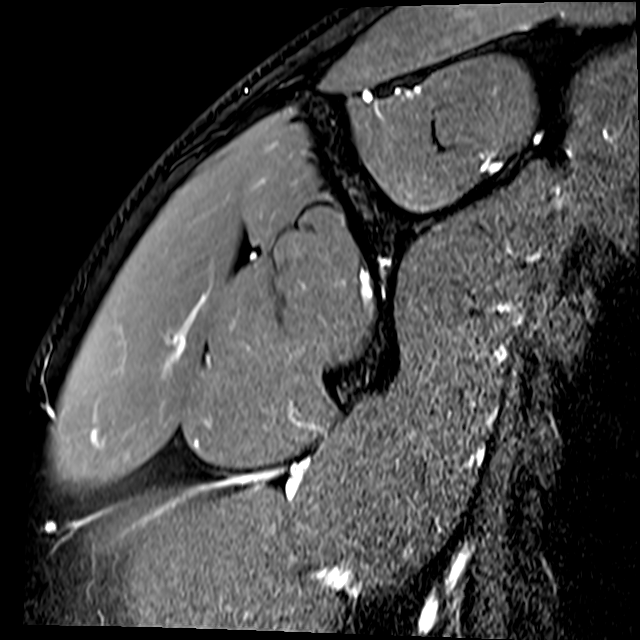
[im 4/22]
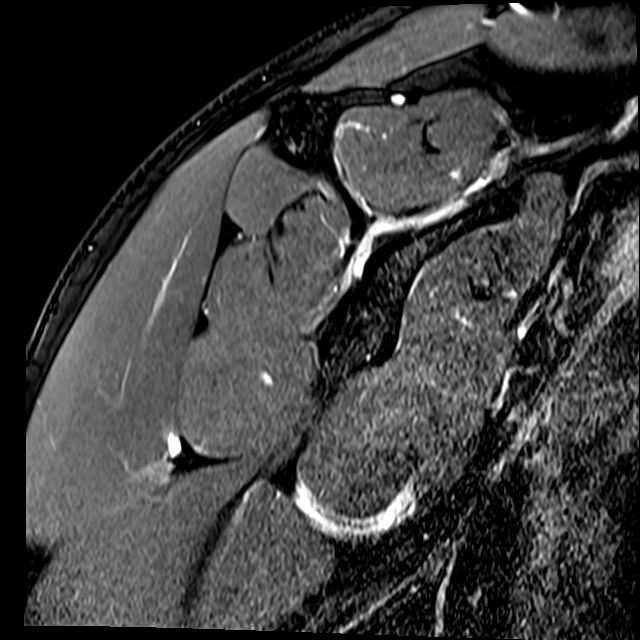
[im 8/22]
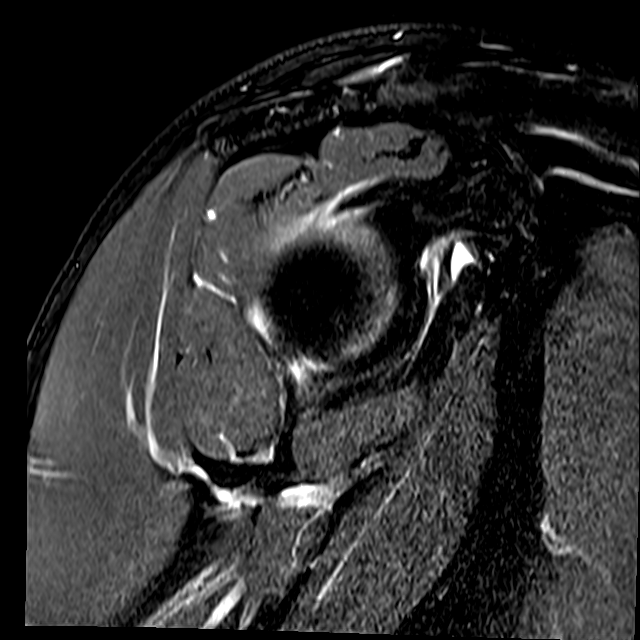
[im 11/22]
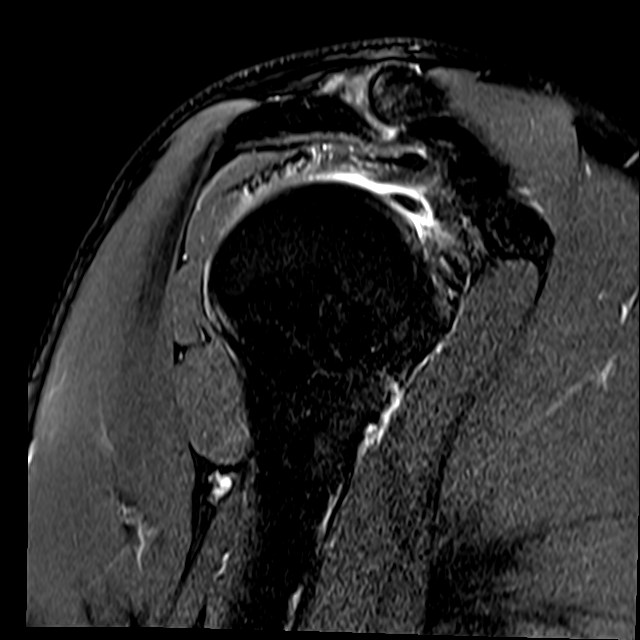
[im 18/22]
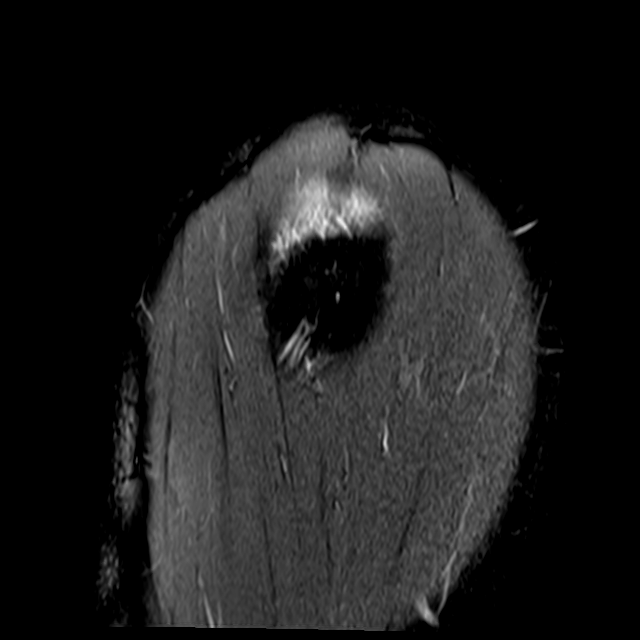

[31 of 40 positions shown; findings below may reference images not displayed]

FINDINGS: Rotator cuff: Moderate intermediate T2 signal tendinosis of the
supraspinatus greater than mid to anterior infraspinatus diffusely.
There multiple scattered linear areas of increased T2 signal within
the midsubstance of the anterior supraspinatus (coronal images 16
and 17), mid AP supraspinatus (coronal image 15) and posterior
supraspinatus/anterior infraspinatus (coronal images 13 and 14)
tendon footprints, tiny partial-thickness tears without tendon
retraction. The posterior supraspinatus tear also mildly extends
into the mild-to-moderate superior subscapularis tendinosis. The
teres minor is intact.

Muscles: No rotator cuff muscle atrophy, fatty infiltration, or
edema.

Biceps long head: Very mild proximal long head of the biceps
intermediate T2 signal tendinosis.

Acromioclavicular Joint: There are mild degenerative changes of the
acromioclavicular joint including joint space narrowing, subchondral
marrow edema, and peripheral osteophytosis. Marrow edema is greatest
within the clavicular head. Type II acromion. Mild
subacromial/subdeltoid bursitis.

Glenohumeral Joint: Mild glenoid and humeral head cartilage
thinning.

Labrum: Mild attenuation of the posterior labrum and junction of the
posterior and superior labrum (axial images 13 through 16, coronal
image 13).

Bones: There is moderate marrow edema within the greater tuberosity
deep to the anterior infraspinatus tendon insertion and deep to the
anterior infraspinatus thin partial-thickness midsubstance tear.

Other: None.
IMPRESSION: :
IMPRESSION: 1. Diffuse moderate supraspinatus and infraspinatus tendinosis with
multiple thin partial-thickness tendon footprint tears of the
supraspinatus and anterior infraspinatus. No significant tendon
retraction. There is moderate edema deep to the anterior
infraspinatus tendon tear.
2. Mild degenerative changes of the acromioclavicular joint
including clavicular head subchondral marrow edema that may
represent a source of pain.

## 2021-10-20 ENCOUNTER — Telehealth: Payer: Self-pay

## 2021-10-20 DIAGNOSIS — G8929 Other chronic pain: Secondary | ICD-10-CM

## 2021-10-20 NOTE — Telephone Encounter (Signed)
Referral to ortho palced ?

## 2021-11-10 ENCOUNTER — Other Ambulatory Visit: Payer: Self-pay | Admitting: Internal Medicine

## 2021-11-10 MED ORDER — GABAPENTIN 300 MG PO CAPS: 900.0000 mg | ORAL_CAPSULE | Freq: Every evening | ORAL | 0 refills | Status: DC | PRN

## 2021-11-10 NOTE — Telephone Encounter (Signed)
Patient states he has to pay a percentage up front at Ortho and he will have to wait for a time when he gets  paid and can afford. Patient request a Rx for heating pad- patient advised that may be an out of pocket expense- I am not sure is Rx would cover- will send to office- but this would be medical supply item. Advised heat may be helpful- but warned about over use and not to sleep with heat on all night.  ?Patient states he was offered RF gabapentin- but he declined- he had some- but now he is out and request RF. ?

## 2021-11-10 NOTE — Telephone Encounter (Signed)
Copied from [place] [phone]. Topic: General - Other ?>> Nov 10, 2021  8:50 AM Tessa Lerner A wrote: ?Reason for CRM: The patient shares that they're continuing to experience right shoulder discomfort  ? ?The patient would like to be prescribed a heating pad ? ?The patient has declined to schedule an additional appointment at the time of call with agent ? ?Please contact further ?

## 2021-11-20 ENCOUNTER — Other Ambulatory Visit: Payer: Self-pay | Admitting: Internal Medicine

## 2021-11-20 NOTE — Telephone Encounter (Signed)
Rx request for gabapentin was filled 11/10/21 by PCP. Will refuse this medication. Rx request for methocarbamol was discontinued 09/18/21. Will refuse this medication. ? ?Requested Prescriptions  ?Pending Prescriptions Disp Refills  ?? gabapentin (NEURONTIN) 300 MG capsule [Pharmacy Med Name: GABAPENTIN 300 MG CAPSULE] 270 capsule 0  ?  Sig: TAKE 3 CAPSULES (900 MG TOTAL) BY MOUTH AT BEDTIME AS NEEDED.  ?  ? Neurology: Anticonvulsants - gabapentin Failed - 11/20/2021  9:43 AM  ?  ?  Failed - Cr in normal range and within 360 days  ?  Creat  ?Date Value Ref Range Status  ?10/05/2019 0.93 0.70 - 1.33 mg/dL Final  ?  Comment:  ?  For patients >48 years of age, the reference limit ?for Creatinine is approximately 13% higher for people ?identified as African-American. ?. ?  ? ?Creatinine, Ser  ?Date Value Ref Range Status  ?08/20/2020 1.13 0.61 - 1.24 mg/dL Final  ?   ?  ?  Passed - Completed PHQ-2 or PHQ-9 in the last 360 days  ?  ?  Passed - Valid encounter within last 12 months  ?  Recent Outpatient Visits   ?      ? 2 months ago Chronic pain of both shoulders  ? [place], NP  ? 8 months ago Chronic pain of both shoulders  ? [place], NP  ? 10 months ago Chronic pain of both shoulders  ? [facility] [place], Coralie Keens, NP  ? 12 months ago Prediabetes  ? [place], NP  ? 1 year ago Concussion with loss of consciousness, initial encounter  ? [facility], Lupita Raider, FNP  ?  ?  ?Future Appointments   ?        ? In 1 week Baity, Coralie Keens, NP [place]  ?  ? ?  ?  ?  ?? methocarbamol (ROBAXIN) 500 MG tablet [Pharmacy Med Name: METHOCARBAMOL 500 MG TABLET] 20 tablet 0  ?  Sig: TAKE 1 TABLET (500 MG TOTAL) BY MOUTH EVERY DAY AT BEDTIME AS NEEDED FOR MUSCLE SPASM  ?  ? Not Delegated - Analgesics:  Muscle Relaxants Failed - 11/20/2021  9:43 AM  ?  ?  Failed - This refill  cannot be delegated  ?  ?  Passed - Valid encounter within last 6 months  ?  Recent Outpatient Visits   ?      ? 2 months ago Chronic pain of both shoulders  ? [place], NP  ? 8 months ago Chronic pain of both shoulders  ? [place], NP  ? 10 months ago Chronic pain of both shoulders  ? [facility] [address] [place], Coralie Keens, NP  ? 12 months ago Prediabetes  ? [place], NP  ? 1 year ago Concussion with loss of consciousness, initial encounter  ? [facility], Lupita Raider, FNP  ?  ?  ?Future Appointments   ?        ? In 1 week Baity, Coralie Keens, NP [facility] [place]  ?  ? ?  ?  ?  ? ? ?

## 2021-11-27 ENCOUNTER — Ambulatory Visit (INDEPENDENT_AMBULATORY_CARE_PROVIDER_SITE_OTHER): Payer: 59 | Admitting: Internal Medicine

## 2021-11-27 ENCOUNTER — Telehealth: Payer: Self-pay | Admitting: Internal Medicine

## 2021-11-27 ENCOUNTER — Encounter: Payer: Self-pay | Admitting: Internal Medicine

## 2021-11-27 ENCOUNTER — Other Ambulatory Visit: Payer: Self-pay | Admitting: Internal Medicine

## 2021-11-27 VITALS — BP 108/64 | HR 71 | Temp 97.3°F | Ht 71.0 in | Wt 155.0 lb

## 2021-11-27 DIAGNOSIS — Z125 Encounter for screening for malignant neoplasm of prostate: Secondary | ICD-10-CM

## 2021-11-27 DIAGNOSIS — R7303 Prediabetes: Secondary | ICD-10-CM

## 2021-11-27 DIAGNOSIS — E782 Mixed hyperlipidemia: Secondary | ICD-10-CM | POA: Insufficient documentation

## 2021-11-27 DIAGNOSIS — Z0001 Encounter for general adult medical examination with abnormal findings: Secondary | ICD-10-CM | POA: Diagnosis not present

## 2021-11-27 MED ORDER — METHOCARBAMOL 750 MG PO TABS: 750.0000 mg | ORAL_TABLET | Freq: Every evening | ORAL | 1 refills | Status: DC | PRN

## 2021-11-27 NOTE — Telephone Encounter (Signed)
Copied from [place] [id number]. Topic: General - Other ?>> Nov 27, 2021  1:22 PM Tessa Lerner A wrote: ?Reason for CRM: Medication Refill - Medication: tiZANidine (ZANAFLEX) 4 MG tablet [[id number]]  ? ?Has the patient contacted their pharmacy? Yes.  The patient has been directed to contact their PCP ?(Agent: If no, request that the patient contact the pharmacy for the refill. If patient does not wish to contact the pharmacy document the reason why and proceed with request.) ?(Agent: If yes, when and what did the pharmacy advise?) ? ?Preferred Pharmacy (with phone number or street name): [id number] [id number]?Phone: [phone]: [phone]?Hours: Not open 24 hours ? ?Has the patient been seen for an appointment in the last year OR does the patient have an upcoming appointment? Yes.   ? ?Agent: Please be advised that RX refills may take up to 3 business days. We ask that you follow-up with your pharmacy. ?

## 2021-11-27 NOTE — Patient Instructions (Signed)
Health Maintenance, Male Adopting a healthy lifestyle and getting preventive care are important in promoting health and wellness. Ask your health care provider about: The right schedule for you to have regular tests and exams. Things you can do on your own to prevent diseases and keep yourself healthy. What should I know about diet, weight, and exercise? Eat a healthy diet  Eat a diet that includes plenty of vegetables, fruits, low-fat dairy products, and lean protein. Do not eat a lot of foods that are high in solid fats, added sugars, or sodium. Maintain a healthy weight Body mass index (BMI) is a measurement that can be used to identify possible weight problems. It estimates body fat based on height and weight. Your health care provider can help determine your BMI and help you achieve or maintain a healthy weight. Get regular exercise Get regular exercise. This is one of the most important things you can do for your health. Most adults should: Exercise for at least 150 minutes each week. The exercise should increase your heart rate and make you sweat (moderate-intensity exercise). Do strengthening exercises at least twice a week. This is in addition to the moderate-intensity exercise. Spend less time sitting. Even light physical activity can be beneficial. Watch cholesterol and blood lipids Have your blood tested for lipids and cholesterol at 53 years of age, then have this test every 5 years. You may need to have your cholesterol levels checked more often if: Your lipid or cholesterol levels are high. You are older than 53 years of age. You are at high risk for heart disease. What should I know about cancer screening? Many types of cancers can be detected early and may often be prevented. Depending on your health history and family history, you may need to have cancer screening at various ages. This may include screening for: Colorectal cancer. Prostate cancer. Skin cancer. Lung  cancer. What should I know about heart disease, diabetes, and high blood pressure? Blood pressure and heart disease High blood pressure causes heart disease and increases the risk of stroke. This is more likely to develop in people who have high blood pressure readings or are overweight. Talk with your health care provider about your target blood pressure readings. Have your blood pressure checked: Every 3-5 years if you are 18-39 years of age. Every year if you are 40 years old or older. If you are between the ages of 65 and 75 and are a current or former smoker, ask your health care provider if you should have a one-time screening for abdominal aortic aneurysm (AAA). Diabetes Have regular diabetes screenings. This checks your fasting blood sugar level. Have the screening done: Once every three years after age 45 if you are at a normal weight and have a low risk for diabetes. More often and at a younger age if you are overweight or have a high risk for diabetes. What should I know about preventing infection? Hepatitis B If you have a higher risk for hepatitis B, you should be screened for this virus. Talk with your health care provider to find out if you are at risk for hepatitis B infection. Hepatitis C Blood testing is recommended for: Everyone born from 1945 through 1965. Anyone with known risk factors for hepatitis C. Sexually transmitted infections (STIs) You should be screened each year for STIs, including gonorrhea and chlamydia, if: You are sexually active and are younger than 53 years of age. You are older than 53 years of age and your   health care provider tells you that you are at risk for this type of infection. Your sexual activity has changed since you were last screened, and you are at increased risk for chlamydia or gonorrhea. Ask your health care provider if you are at risk. Ask your health care provider about whether you are at high risk for HIV. Your health care provider  may recommend a prescription medicine to help prevent HIV infection. If you choose to take medicine to prevent HIV, you should first get tested for HIV. You should then be tested every 3 months for as long as you are taking the medicine. Follow these instructions at home: Alcohol use Do not drink alcohol if your health care provider tells you not to drink. If you drink alcohol: Limit how much you have to 0-2 drinks a day. Know how much alcohol is in your drink. In the [place] one drink equals one 12 oz bottle of beer (355 mL), one 5 oz glass of wine (148 mL), or one 1 oz glass of hard liquor (44 mL). Lifestyle Do not use any products that contain nicotine or tobacco. These products include cigarettes, chewing tobacco, and vaping devices, such as e-cigarettes. If you need help quitting, ask your health care provider. Do not use street drugs. Do not share needles. Ask your health care provider for help if you need support or information about quitting drugs. General instructions Schedule regular health, dental, and eye exams. Stay current with your vaccines. Tell your health care provider if: You often feel depressed. You have ever been abused or do not feel safe at home. Summary Adopting a healthy lifestyle and getting preventive care are important in promoting health and wellness. Follow your health care provider's instructions about healthy diet, exercising, and getting tested or screened for diseases. Follow your health care provider's instructions on monitoring your cholesterol and blood pressure. This information is not intended to replace advice given to you by your health care provider. Make sure you discuss any questions you have with your health care provider. Document Revised: 12/09/2020 Document Reviewed: 12/09/2020 Elsevier Patient Education  2023 Elsevier Inc.  

## 2021-11-27 NOTE — Progress Notes (Signed)
? ?Subjective:  ? ? Patient ID: Leonard Henderson, male    DOB: 02-22-1971, 51 y.o.   MRN: [id number] ? ?HPI ? ?Patient presents to clinic today for his annual exam. ? ?Flu: Never ?Tetanus: 08/2020 ?COVID: x 3 ?Shingrix: Never ?PSA screening: 10/2019 ?Colon screening: 11/2019 ?Vision screening: as needed ?Dentist: biannually ? ?Diet: He does eat meat. He consumes fruits and veggies. He does eat some fried foods. He drinks mostly juice. ?Exercise: Walking ? ?Review of Systems ? ?   ?Past Medical History:  ?Diagnosis Date  ? Medical history non-contributory   ? ? ?Current Outpatient Medications  ?Medication Sig Dispense Refill  ? Cod Liver Oil 1000 MG CAPS Take by mouth.    ? ergocalciferol (VITAMIN D2) 1.25 MG (50000 UT) capsule Take 1 capsule by mouth once a week.    ? gabapentin (NEURONTIN) 300 MG capsule Take 3 capsules (900 mg total) by mouth at bedtime as needed. 270 capsule 0  ? methocarbamol (ROBAXIN) 750 MG tablet Take 1 tablet (750 mg total) by mouth at bedtime as needed for muscle spasms. 90 tablet 0  ? ?No current facility-administered medications for this visit.  ? ? ?No Known Allergies ? ?Family History  ?Problem Relation Age of Onset  ? Hypertension Mother   ? Prostate cancer Father 42  ? Hypertension Brother   ? Breast cancer Sister 35  ? ? ?Social History  ? ?Socioeconomic History  ? Marital status: Married  ?  Spouse name: Not on file  ? Number of children: 3  ? Years of education: Not on file  ? Highest education level: High school graduate  ?Occupational History  ? Occupation: cabinets  ?  Comment: [employer]  ?Tobacco Use  ? Smoking status: Never  ? Smokeless tobacco: Never  ?Vaping Use  ? Vaping Use: Never used  ?Substance and Sexual Activity  ? Alcohol use: Not Currently  ?  Comment: on weekends  ? Drug use: Yes  ?  Types: Marijuana  ?  Comment: 2x per month over last 10 years  ? Sexual activity: Yes  ?  Birth control/protection: None  ?Other Topics Concern  ? Not on file  ?Social History Narrative   ? ** Merged History Encounter **  ?    ? ?Social Determinants of Health  ? ?Financial Resource Strain: Not on file  ?Food Insecurity: Not on file  ?Transportation Needs: Not on file  ?Physical Activity: Not on file  ?Stress: Not on file  ?Social Connections: Not on file  ?Intimate Partner Violence: Not on file  ? ? ? ?Constitutional: Denies fever, malaise, fatigue, headache or abrupt weight changes.  ?HEENT: Denies eye pain, eye redness, ear pain, ringing in the ears, wax buildup, runny nose, nasal congestion, bloody nose, or sore throat. ?Respiratory: Denies difficulty breathing, shortness of breath, cough or sputum production.   ?Cardiovascular: Denies chest pain, chest tightness, palpitations or swelling in the hands or feet.  ?Gastrointestinal: Denies abdominal pain, bloating, constipation, diarrhea or blood in the stool.  ?GU: Denies urgency, frequency, pain with urination, burning sensation, blood in urine, odor or discharge. ?Musculoskeletal: Patient reports chronic shoulder pain.  Denies decrease in range of motion, difficulty with gait, muscle pain or joint swelling.  ?Skin: Denies redness, rashes, lesions or ulcercations.  ?Neurological: Pt reports paresthesia of his LLE. Denies dizziness, difficulty with memory, difficulty with speech or problems with balance and coordination.  ?Psych: Denies anxiety, depression, SI/HI. ? ?No other specific complaints in a complete review of  systems (except as listed in HPI above). ? ?Objective:  ? Physical Exam ? ? ?BP 108/64 (BP Location: Left Arm, Patient Position: Sitting, Cuff Size: Normal)   Pulse 71   Temp (!) 97.3 ?F (36.3 ?C) (Temporal)   Ht _0  (1.803 m)   Wt 155 lb (70.3 kg)   SpO2 100%   BMI 21.62 kg/m?  ? ?Wt Readings from Last 3 Encounters:  ?09/18/21 155 lb (70.3 kg)  ?03/05/21 149 lb (67.6 kg)  ?01/16/21 149 lb 3.2 oz (67.7 kg)  ? ? ?General: Appears his stated age, well developed, well nourished in NAD. ?Skin: Warm, dry and intact.  ?HEENT:  Head: normal shape and size; Eyes: sclera white, no icterus, conjunctiva pink, PERRLA and EOMs intact;  ?Neck:  Neck supple, trachea midline. No masses, lumps or thyromegaly present.  ?Cardiovascular: Normal rate and rhythm. S1,S2 noted.  No murmur, rubs or gallops noted. No JVD or BLE edema. No carotid bruits noted. ?Pulmonary/Chest: Normal effort and positive vesicular breath sounds. No respiratory distress. No wheezes, rales or ronchi noted.  ?Abdomen: Soft and nontender. Normal bowel sounds.  ?Musculoskeletal: Strength 5/5 BUE/BLE. No difficulty with gait.  ?Neurological: Alert and oriented. Cranial nerves II-XII grossly intact. Coordination normal.  ?Psychiatric: Mood and affect normal. Behavior is normal. Judgment and thought content normal.  ? ? ? ?BMET ?   ?Component Value Date/Time  ? NA 140 08/20/2020 0516  ? K 4.0 08/20/2020 0516  ? CL 107 08/20/2020 0516  ? CO2 23 08/20/2020 0516  ? GLUCOSE 103 (H) 08/20/2020 0516  ? BUN 11 08/20/2020 0516  ? CREATININE 1.13 08/20/2020 0516  ? CREATININE 0.93 10/05/2019 1036  ? CALCIUM 8.6 (L) 08/20/2020 0516  ? GFRNONAA >60 08/20/2020 0516  ? GFRNONAA 95 10/05/2019 1036  ? GFRAA 111 10/05/2019 1036  ? ? ?Lipid Panel  ?   ?Component Value Date/Time  ? CHOL 240 (H) 10/05/2019 1036  ? TRIG 157 (H) 10/05/2019 1036  ? HDL 51 10/05/2019 1036  ? CHOLHDL 4.7 10/05/2019 1036  ? LDLCALC 160 (H) 10/05/2019 1036  ? ? ?CBC ?   ?Component Value Date/Time  ? WBC 8.0 08/19/2020 0826  ? RBC 4.44 08/19/2020 0826  ? HGB 12.9 (L) 08/19/2020 0826  ? HCT 41.3 08/19/2020 0826  ? PLT 213 08/19/2020 0826  ? MCV 93.0 08/19/2020 0826  ? MCH 29.1 08/19/2020 0826  ? MCHC 31.2 08/19/2020 0826  ? RDW 12.1 08/19/2020 0826  ? LYMPHSABS 1.8 08/19/2020 0826  ? MONOABS 0.8 08/19/2020 0826  ? EOSABS 0.0 08/19/2020 0826  ? BASOSABS 0.0 08/19/2020 0826  ? ? ?Hgb A1C ?Lab Results  ?Component Value Date  ? HGBA1C 5.7 (H) 10/05/2019  ? ? ? ? ? ? ?   ?Assessment & Plan:  ? ?Preventative Health  Maintenance: ? ?Encouraged to get a flu shot in the fall ?Tetanus UTD ?Encouraged him to get his COVID booster and bring his immunization care to his next visit ?Colon screening UTD ?Encouraged him to consume a balanced diet and exercise regimen ?Advised him to see an eye doctor and dentist annually ?We will check CBC, c-Met, lipid, A1c, PSA today ? ?RTC in 6 months, follow-up chronic conditions ?

## 2021-11-28 ENCOUNTER — Ambulatory Visit (INDEPENDENT_AMBULATORY_CARE_PROVIDER_SITE_OTHER): Payer: 59

## 2021-11-28 DIAGNOSIS — Z23 Encounter for immunization: Secondary | ICD-10-CM | POA: Diagnosis not present

## 2021-11-28 LAB — CBC
HCT: 45 % (ref 38.5–50.0)
Hemoglobin: 14.8 g/dL (ref 13.2–17.1)
MCH: 30 pg (ref 27.0–33.0)
MCHC: 32.9 g/dL (ref 32.0–36.0)
MCV: 91.1 fL (ref 80.0–100.0)
MPV: 11.4 fL (ref 7.5–12.5)
Platelets: 275 10*3/uL (ref 140–400)
RBC: 4.94 10*6/uL (ref 4.20–5.80)
RDW: 12.3 % (ref 11.0–15.0)
WBC: 6.4 10*3/uL (ref 3.8–10.8)

## 2021-11-28 LAB — HEMOGLOBIN A1C
Hgb A1c MFr Bld: 5.9 % of total Hgb — ABNORMAL HIGH (ref <5.7)
Mean Plasma Glucose: 123 mg/dL
eAG (mmol/L): 6.8 mmol/L

## 2021-11-28 LAB — COMPLETE METABOLIC PANEL WITH GFR
AG Ratio: 1.5 (calc) (ref 1.0–2.5)
ALT: 18 U/L (ref 9–46)
AST: 18 U/L (ref 10–35)
Albumin: 4.1 g/dL (ref 3.6–5.1)
Alkaline phosphatase (APISO): 60 U/L (ref 35–144)
BUN: 17 mg/dL (ref 7–25)
CO2: 30 mmol/L (ref 20–32)
Calcium: 9.7 mg/dL (ref 8.6–10.3)
Chloride: 106 mmol/L (ref 98–110)
Creat: 1.25 mg/dL (ref 0.70–1.30)
Globulin: 2.8 g/dL (calc) (ref 1.9–3.7)
Glucose, Bld: 66 mg/dL (ref 65–139)
Potassium: 4.7 mmol/L (ref 3.5–5.3)
Sodium: 143 mmol/L (ref 135–146)
Total Bilirubin: 0.4 mg/dL (ref 0.2–1.2)
Total Protein: 6.9 g/dL (ref 6.1–8.1)
eGFR: 69 mL/min/{1.73_m2} (ref >=60)

## 2021-11-28 LAB — LIPID PANEL
Cholesterol: 210 mg/dL — ABNORMAL HIGH (ref <200)
HDL: 48 mg/dL (ref >=40)
LDL Cholesterol (Calc): 136 mg/dL (calc) — ABNORMAL HIGH
Non-HDL Cholesterol (Calc): 162 mg/dL (calc) — ABNORMAL HIGH (ref <130)
Total CHOL/HDL Ratio: 4.4 (calc) (ref <5.0)
Triglycerides: 134 mg/dL (ref <150)

## 2021-11-28 LAB — PSA: PSA: 1.76 ng/mL (ref <=4.00)

## 2021-11-28 MED ORDER — ATORVASTATIN CALCIUM 10 MG PO TABS: 10.0000 mg | ORAL_TABLET | Freq: Every day | ORAL | 0 refills | Status: DC

## 2021-11-28 NOTE — Telephone Encounter (Signed)
We discussed this yesterday. I refilled his Methocarbamol. Insurance will not pay for Zanaflex ?

## 2021-11-28 NOTE — Addendum Note (Signed)
Addended by: Jearld Fenton on: 11/28/2021 12:36 PM ? ? Modules accepted: Orders ? ?

## 2021-11-28 NOTE — Telephone Encounter (Signed)
Requested medication (s) are due for refill today - no ? ?Requested medication (s) are on the active medication list -yes- dose change ? ?Future visit scheduled -yes ? ?Last refill: 08/25/21 ? ?Notes to clinic: Request RF: non delegated Rx- filled yesterday at different dosing ? ?Requested Prescriptions  ?Pending Prescriptions Disp Refills  ? methocarbamol (ROBAXIN) 500 MG tablet [Pharmacy Med Name: METHOCARBAMOL 500 MG TABLET] 20 tablet 0  ?  Sig: TAKE 1 TABLET (500 MG TOTAL) BY MOUTH EVERY DAY AT BEDTIME AS NEEDED FOR MUSCLE SPASM  ?  ? Not Delegated - Analgesics:  Muscle Relaxants Failed - 11/27/2021  1:14 PM  ?  ?  Failed - This refill cannot be delegated  ?  ?  Passed - Valid encounter within last 6 months  ?  Recent Outpatient Visits   ? ?      ? Yesterday Encounter for general adult medical examination with abnormal findings  ? [facility] [place], [place] W, NP  ? 2 months ago Chronic pain of both shoulders  ? [place], NP  ? 8 months ago Chronic pain of both shoulders  ? [place], NP  ? 10 months ago Chronic pain of both shoulders  ? [facility] [place], Coralie Keens, NP  ? 1 year ago Prediabetes  ? [facility] [place], Coralie Keens, NP  ? ?  ?  ?Future Appointments   ? ?        ? In 6 months Baity, Coralie Keens, NP [facility], [place]  ? ?  ? ? ?  ?  ?  ?Refused Prescriptions Disp Refills  ? gabapentin (NEURONTIN) 300 MG capsule [Pharmacy Med Name: GABAPENTIN 300 MG CAPSULE] 270 capsule 0  ?  Sig: TAKE 3 CAPSULES (900 MG TOTAL) BY MOUTH AT BEDTIME AS NEEDED.  ?  ? Neurology: Anticonvulsants - gabapentin Failed - 11/27/2021  1:14 PM  ?  ?  Failed - Cr in normal range and within 360 days  ?  Creat  ?Date Value Ref Range Status  ?11/27/2021 1.25 0.70 - 1.30 mg/dL Final  ?  ?  ?  ?  Passed - Completed PHQ-2 or PHQ-9 in the last 360 days  ?  ?  Passed - Valid encounter within last 12 months  ?  Recent  Outpatient Visits   ? ?      ? Yesterday Encounter for general adult medical examination with abnormal findings  ? [facility] [place], [place] W, NP  ? 2 months ago Chronic pain of both shoulders  ? [place], NP  ? 8 months ago Chronic pain of both shoulders  ? [place], NP  ? 10 months ago Chronic pain of both shoulders  ? [facility] [place], Coralie Keens, NP  ? 1 year ago Prediabetes  ? [facility] [place], Coralie Keens, NP  ? ?  ?  ?Future Appointments   ? ?        ? In 6 months Baity, Coralie Keens, NP [facility], [place]  ? ?  ? ? ?  ?  ?  ? ? ? ?Requested Prescriptions  ?Pending Prescriptions Disp Refills  ? methocarbamol (ROBAXIN) 500 MG tablet [Pharmacy Med Name: METHOCARBAMOL 500 MG TABLET] 20 tablet 0  ?  Sig: TAKE 1 TABLET (500 MG  TOTAL) BY MOUTH EVERY DAY AT BEDTIME AS NEEDED FOR MUSCLE SPASM  ?  ? Not Delegated - Analgesics:  Muscle Relaxants Failed - 11/27/2021  1:14 PM  ?  ?  Failed - This refill cannot be delegated  ?  ?  Passed - Valid encounter within last 6 months  ?  Recent Outpatient Visits   ? ?      ? Yesterday Encounter for general adult medical examination with abnormal findings  ? [facility] [place], [place] W, NP  ? 2 months ago Chronic pain of both shoulders  ? [place], NP  ? 8 months ago Chronic pain of both shoulders  ? [place], NP  ? 10 months ago Chronic pain of both shoulders  ? [facility] [place], Coralie Keens, NP  ? 1 year ago Prediabetes  ? [facility] [place], Coralie Keens, NP  ? ?  ?  ?Future Appointments   ? ?        ? In 6 months Baity, Coralie Keens, NP [facility] [place], [place]  ? ?  ? ? ?  ?  ?  ?Refused Prescriptions Disp Refills  ? gabapentin (NEURONTIN) 300 MG capsule [Pharmacy Med Name: GABAPENTIN 300 MG CAPSULE] 270 capsule 0  ?  Sig: TAKE 3 CAPSULES  (900 MG TOTAL) BY MOUTH AT BEDTIME AS NEEDED.  ?  ? Neurology: Anticonvulsants - gabapentin Failed - 11/27/2021  1:14 PM  ?  ?  Failed - Cr in normal range and within 360 days  ?  Creat  ?Date Value Ref Range Status  ?11/27/2021 1.25 0.70 - 1.30 mg/dL Final  ?  ?  ?  ?  Passed - Completed PHQ-2 or PHQ-9 in the last 360 days  ?  ?  Passed - Valid encounter within last 12 months  ?  Recent Outpatient Visits   ? ?      ? Yesterday Encounter for general adult medical examination with abnormal findings  ? [facility] [place], [place] W, NP  ? 2 months ago Chronic pain of both shoulders  ? [place], NP  ? 8 months ago Chronic pain of both shoulders  ? [place], NP  ? 10 months ago Chronic pain of both shoulders  ? [facility] [place], Coralie Keens, NP  ? 1 year ago Prediabetes  ? [facility] [place], Coralie Keens, NP  ? ?  ?  ?Future Appointments   ? ?        ? In 6 months Baity, Coralie Keens, NP [facility], [place]  ? ?  ? ? ?  ?  ?  ? ? ? ?

## 2021-11-28 NOTE — Telephone Encounter (Signed)
Call to pharmacy- gabapentin was received and filled 11/10/21 ?Requested Prescriptions  ?Pending Prescriptions Disp Refills  ?? methocarbamol (ROBAXIN) 500 MG tablet [Pharmacy Med Name: METHOCARBAMOL 500 MG TABLET] 20 tablet 0  ?  Sig: TAKE 1 TABLET (500 MG TOTAL) BY MOUTH EVERY DAY AT BEDTIME AS NEEDED FOR MUSCLE SPASM  ?  ? Not Delegated - Analgesics:  Muscle Relaxants Failed - 11/27/2021  1:14 PM  ?  ?  Failed - This refill cannot be delegated  ?  ?  Passed - Valid encounter within last 6 months  ?  Recent Outpatient Visits   ?      ? Yesterday Encounter for general adult medical examination with abnormal findings  ? [facility] [place], [place] W, NP  ? 2 months ago Chronic pain of both shoulders  ? [place], NP  ? 8 months ago Chronic pain of both shoulders  ? [place], NP  ? 10 months ago Chronic pain of both shoulders  ? [facility] [name], Coralie Keens, NP  ? 1 year ago Prediabetes  ? [facility] [place], Coralie Keens, NP  ?  ?  ?Future Appointments   ?        ? In 6 months Baity, Coralie Keens, NP [place]  ?  ? ?  ?  ?  ?? gabapentin (NEURONTIN) 300 MG capsule [Pharmacy Med Name: GABAPENTIN 300 MG CAPSULE] 270 capsule 0  ?  Sig: TAKE 3 CAPSULES (900 MG TOTAL) BY MOUTH AT BEDTIME AS NEEDED.  ?  ? Neurology: Anticonvulsants - gabapentin Failed - 11/27/2021  1:14 PM  ?  ?  Failed - Cr in normal range and within 360 days  ?  Creat  ?Date Value Ref Range Status  ?11/27/2021 1.25 0.70 - 1.30 mg/dL Final  ?   ?  ?  Passed - Completed PHQ-2 or PHQ-9 in the last 360 days  ?  ?  Passed - Valid encounter within last 12 months  ?  Recent Outpatient Visits   ?      ? Yesterday Encounter for general adult medical examination with abnormal findings  ? [facility] [place], [place], NP  ? 2 months ago Chronic pain of both shoulders  ? [place], NP  ? 8  months ago Chronic pain of both shoulders  ? [place], NP  ? 10 months ago Chronic pain of both shoulders  ? [facility] [place], Coralie Keens, NP  ? 1 year ago Prediabetes  ? [facility] [place], Coralie Keens, NP  ?  ?  ?Future Appointments   ?        ? In 6 months Baity, Coralie Keens, NP [facility], [place]  ?  ? ?  ?  ?  ? ?

## 2022-01-06 ENCOUNTER — Encounter: Payer: Self-pay | Admitting: Family Medicine

## 2022-01-06 ENCOUNTER — Ambulatory Visit (INDEPENDENT_AMBULATORY_CARE_PROVIDER_SITE_OTHER): Payer: 59 | Admitting: Family Medicine

## 2022-01-06 VITALS — BP 116/62 | HR 72 | Ht 71.0 in | Wt 153.0 lb

## 2022-01-06 DIAGNOSIS — M75111 Incomplete rotator cuff tear or rupture of right shoulder, not specified as traumatic: Secondary | ICD-10-CM

## 2022-01-06 DIAGNOSIS — G8929 Other chronic pain: Secondary | ICD-10-CM

## 2022-01-06 DIAGNOSIS — M25512 Pain in left shoulder: Secondary | ICD-10-CM

## 2022-01-06 DIAGNOSIS — M7581 Other shoulder lesions, right shoulder: Secondary | ICD-10-CM | POA: Diagnosis not present

## 2022-01-06 DIAGNOSIS — M25511 Pain in right shoulder: Secondary | ICD-10-CM | POA: Diagnosis not present

## 2022-01-06 MED ORDER — METHYLPREDNISOLONE ACETATE 40 MG/ML IJ SUSP
40.0000 mg | Freq: Once | INTRAMUSCULAR | Status: AC
  Administered 2022-01-06: 40 mg via INTRA_ARTICULAR

## 2022-01-06 MED ORDER — LIDOCAINE HCL (PF) 1 % IJ SOLN
4.0000 mL | Freq: Once | INTRAMUSCULAR | Status: AC
  Administered 2022-01-06: 4 mL

## 2022-01-06 NOTE — Patient Instructions (Addendum)
Thank you for coming to the office today.  You received a Right Shoulder Joint steroid injection today. - Lidocaine numbing medicine may ease the pain initially for a few hours until it wears off - As discussed, you may experience a "steroid flare" this evening or within 24-48 hours, anytime medicine is injected into an inflamed joint it can cause the pain to get worse temporarily - Everyone responds differently to these injections, it depends on the patient and the severity of the joint problem, it may provide anywhere from days to weeks, to months of relief. Ideal response is >6 months relief - Try to take it easy for next 1-2 days, avoid over activity and strain on joint (limit lifting for shoulder) - Recommend the following:   - For swelling - rest, compression sleeve / ACE wrap, elevation, and ice packs as needed for first few days   - For pain in future may use heating pad or moist heat as needed  START anti inflammatory topical - OTC Voltaren (generic Diclofenac) topical 2-4 times a day as needed for pain swelling of affected joint for 1-2 weeks or longer.  Go ahead and arrange your payment and schedule with Orthopedics  Please schedule a Follow-up Appointment to: Return if symptoms worsen or fail to improve.  If you have any other questions or concerns, please feel free to call the office or send a message through [place]. You may also schedule an earlier appointment if necessary.  Additionally, you may be receiving a survey about your experience at our office within a few days to 1 week by e-mail or mail. We value your feedback.  Nobie Putnam, DO [facility], [facility]   Range of Motion Shoulder Exercises  [place] with your good arm against a counter or table for support [employer] forward with a wide stance (make sure your body is comfortable) - Your painful shoulder should hang down and feel "heavy" - Gently move your painful arm in small  circles "clockwise" for several turns - Switch to "counterclockwise" for several turns - Early on keep circles narrow and move slowly - Later in rehab, move in larger circles and faster movement   Wall Crawl - Stand close (about 1-2 ft away) to a wall, facing it directly - Reach out with your arm of painful shoulder and place fingers (not palm) on wall - You should make contact with wall at your waist level - Slowly walk your fingers up the wall. Stay in contact with wall entire time, do not remove fingers - Keep walking fingers up wall until you reach shoulder level - You may feel tightening or mild discomfort, once you reach a height that causes pain or if you are already above your shoulder height then stop. Repeat from starting position. - Early on stand closer to wall, move fingers slowly, and stay at or below shoulder level - Later in rehab, stand farther away from wall (fingertips), move fingers quicker, go above shoulder level

## 2022-01-06 NOTE — Progress Notes (Signed)
Subjective:    Patient ID: Leonard Henderson, male    DOB: 03/28/1971, 51 y.o.   MRN: [id number]  Leonard Henderson is a 51 y.o. male presenting on 01/06/2022 for Shoulder Pain  PCP Webb Silversmith, FNP   HPI  Bilateral Shoulder Pain, R>L Shoulder Pain Rotator Cuff Tendon partial tears multiple locations Subacute on chronic with bursitis symptoms  Followed by PCP Rollene Fare for this problem. Reviewed the chart from 03/2021 and 10/2021, he has had MRI bilateral shoulders. Showed tendonitis and partial thickness tear. Also arthritic changes of AC joint.  Taking Methocarbamol PRN nightly helps rest. But not solving the pain.  He was unable to see Orthopedics due to outstanding bill  Here for injection in shoulder today  Admits awakening due to pain in shoulder keeping him awake.      11/27/2021    8:58 AM 09/18/2021    3:16 PM 11/25/2020   10:15 AM  Depression screen PHQ 2/9  Decreased Interest 0 0 0  Down, Depressed, Hopeless 0 0 0  PHQ - 2 Score 0 0 0  Altered sleeping 0 0 0  Tired, decreased energy 0 0 1  Change in appetite 0 0 0  Feeling bad or failure about yourself  0 0 0  Trouble concentrating 0 0 0  Moving slowly or fidgety/restless 0 0 0  Suicidal thoughts 0 0 0  PHQ-9 Score 0 0 1  Difficult doing work/chores Not difficult at all Not difficult at all Not difficult at all    Social History   Tobacco Use   Smoking status: Never   Smokeless tobacco: Never  Vaping Use   Vaping Use: Never used  Substance Use Topics   Alcohol use: Not Currently    Comment: on weekends   Drug use: Yes    Types: Marijuana    Comment: 2x per month over last 10 years    Review of Systems Per HPI unless specifically indicated above     Objective:    BP 116/62   Pulse 72   Ht $R'5\' 11"'zE$  (1.803 m)   Wt 153 lb (69.4 kg)   SpO2 99%   BMI 21.34 kg/m   Wt Readings from Last 3 Encounters:  01/06/22 153 lb (69.4 kg)  11/27/21 155 lb (70.3 kg)  09/18/21 155 lb (70.3 kg)    Physical  Exam Vitals and nursing note reviewed.  Constitutional:      General: He is not in acute distress.    Appearance: Normal appearance. He is well-developed. He is not diaphoretic.     Comments: Well-appearing, comfortable, cooperative  HENT:     Head: Normocephalic and atraumatic.  Eyes:     General:        Right eye: No discharge.        Left eye: No discharge.     Conjunctiva/sclera: Conjunctivae normal.  Cardiovascular:     Rate and Rhythm: Normal rate.  Pulmonary:     Effort: Pulmonary effort is normal.  Musculoskeletal:     Comments: Right Shoulder Inspection: Normal appearance bilateral symmetrical Palpation: Non-tender to palpation over anterior, lateral, or posterior shoulder  ROM: REDUCED active ROM forward flexion, abduction, internal / external rotation due to pain.  Special Testing: Rotator cuff testing positive for pain and weakness R supraspinatus positive impingement testing. Strength: Normal strength 5/5 flex/ext, ext rot / int rot, grip, rotator cuff str testing. Neurovascular: Distally intact pulses, sensation to light touch   Skin:    General: Skin  is warm and dry.     Findings: No erythema or rash.  Neurological:     Mental Status: He is alert and oriented to person, place, and time.  Psychiatric:        Mood and Affect: Mood normal.        Behavior: Behavior normal.        Thought Content: Thought content normal.     Comments: Well groomed, good eye contact, normal speech and thoughts   ________________________________________________________ PROCEDURE NOTE Date: 01/06/22 Right Shoulder subacromial injection Discussed benefits and risks (including pain, bleeding, infection, steroid flare). Verbal consent given by patient. Medication:  1 cc Depo-medrol $RemoveBefore'40mg'wjiWvfrJJPhRf$  and 4 cc Lidocaine 1% without epi Time Out taken  Landmarks identified. Area cleansed with alcohol wipes. Using 21 gauge and 1, 1/2 inch needle, Right subacromial bursa space was injected (with above  listed medication) via posterior approach cold spray used for superficial anesthetic. Sterile bandage placed. Patient tolerated procedure well without bleeding or paresthesias. No complications.   I have personally reviewed the radiology report from MRI 10/16/21 bilateral shoulders   MR Shoulder Right Wo ContrastPerformed 10/16/2021 Final result  Study Result CLINICAL DATA: Bilateral shoulder pain radiates into neck for 1 + years related to heavy lifting at work. Clinical concern for rotator cuff disorder.  EXAM: MRI OF THE RIGHT SHOULDER WITHOUT CONTRAST  TECHNIQUE: Multiplanar, multisequence MR imaging of the shoulder was performed. No intravenous contrast was administered.  COMPARISON: X-ray shoulder 01/16/2021.  FINDINGS: Rotator cuff: Moderate intermediate T2 signal tendinosis of the supraspinatus greater than mid to anterior infraspinatus diffusely. There multiple scattered linear areas of increased T2 signal within the midsubstance of the anterior supraspinatus (coronal images 16 and 17), mid AP supraspinatus (coronal image 15) and posterior supraspinatus/anterior infraspinatus (coronal images 13 and 14) tendon footprints, tiny partial-thickness tears without tendon retraction. The posterior supraspinatus tear also mildly extends into the mild-to-moderate superior subscapularis tendinosis. The teres minor is intact.  Muscles: No rotator cuff muscle atrophy, fatty infiltration, or edema.  Biceps long head: Very mild proximal long head of the biceps intermediate T2 signal tendinosis.  Acromioclavicular Joint: There are mild degenerative changes of the acromioclavicular joint including joint space narrowing, subchondral marrow edema, and peripheral osteophytosis. Marrow edema is greatest within the clavicular head. Type II acromion. Mild subacromial/subdeltoid bursitis.  Glenohumeral Joint: Mild glenoid and humeral head cartilage thinning.  Labrum: Mild attenuation  of the posterior labrum and junction of the posterior and superior labrum (axial images 13 through 16, coronal image 13).  Bones: There is moderate marrow edema within the greater tuberosity deep to the anterior infraspinatus tendon insertion and deep to the anterior infraspinatus thin partial-thickness midsubstance tear.  Other: None.  IMPRESSION: 1. Diffuse moderate supraspinatus and infraspinatus tendinosis with multiple thin partial-thickness tendon footprint tears of the supraspinatus and anterior infraspinatus. No significant tendon retraction. There is moderate edema deep to the anterior infraspinatus tendon tear. 2. Mild degenerative changes of the acromioclavicular joint including clavicular head subchondral marrow edema that may represent a source of pain.   Electronically Signed By: Yvonne Kendall M.D. On: 10/17/2021 22:03  --------------------------  MR Shoulder Left Wo ContrastPerformed 10/16/2021 Final result  Study Result CLINICAL DATA: Bilateral shoulder pain radiates into neck for 1 + years related to heavy lifting at work. Clinical concern for rotator cuff disorder.  EXAM: MRI OF THE LEFT SHOULDER WITHOUT CONTRAST  TECHNIQUE: Multiplanar, multisequence MR imaging of the shoulder was performed. No intravenous contrast was administered.  COMPARISON: Left shoulder radiographs  01/16/2021  FINDINGS: Rotator cuff: There is moderate diffuse supraspinatus and mid and anterior infraspinatus intermediate T2 signal tendinosisa. Minimal attenuation of the bursal aspect of the slightly anterior supraspinatus tendon footprint (sagittal image 8 and coronal image 12), mild partial-thickness tearing. There are 2 thin linear increased signal possible midsubstance tears within the mid AP dimension of the supraspinatus tendon footprint (coronal image 13). The subscapularis and teres minor are intact.  Muscles: No rotator cuff muscle atrophy, fatty infiltration,  or edema.  Biceps long head: The intra-articular long head of the biceps tendon is intact.  Acromioclavicular Joint: There are mild degenerative changes of the acromioclavicular joint including joint space narrowing, subchondral marrow edema, and peripheral osteophytosis. Type II acromion. No subacromial/subdeltoid bursitis.  Glenohumeral Joint: Mild thinning of the glenoid and humeral head cartilage.  Labrum: Grossly intact, but evaluation is limited by lack of intraarticular fluid.  Bones: No acute fracture.  Other: None.  IMPRESSION:: IMPRESSION: 1. Moderate supraspinatus and mid and anterior infraspinatus tendinosis. 2. Minimal partial-thickness bursal sided tear of the slightly anterior supraspinatus tendon. Additional thin linear possible midsubstance tears within the tendon fibers just posterior to this. No tendon retraction. 3. Mild degenerative changes of the acromioclavicular joint including clavicular head marrow edema that may represent a source of pain.   Electronically Signed By: Yvonne Kendall M.D. On: 10/17/2021 21:57    Results for orders placed or performed in visit on 11/27/21  CBC  Result Value Ref Range   WBC 6.4 3.8 - 10.8 Thousand/uL   RBC 4.94 4.20 - 5.80 Million/uL   Hemoglobin 14.8 13.2 - 17.1 g/dL   HCT 45.0 38.5 - 50.0 %   MCV 91.1 80.0 - 100.0 fL   MCH 30.0 27.0 - 33.0 pg   MCHC 32.9 32.0 - 36.0 g/dL   RDW 12.3 11.0 - 15.0 %   Platelets 275 140 - 400 Thousand/uL   MPV 11.4 7.5 - 12.5 fL  COMPLETE METABOLIC PANEL WITH GFR  Result Value Ref Range   Glucose, Bld 66 65 - 139 mg/dL   BUN 17 7 - 25 mg/dL   Creat 1.25 0.70 - 1.30 mg/dL   eGFR 69 > OR = 60 mL/min/1.61m2   BUN/Creatinine Ratio NOT APPLICABLE 6 - 22 (calc)   Sodium 143 135 - 146 mmol/L   Potassium 4.7 3.5 - 5.3 mmol/L   Chloride 106 98 - 110 mmol/L   CO2 30 20 - 32 mmol/L   Calcium 9.7 8.6 - 10.3 mg/dL   Total Protein 6.9 6.1 - 8.1 g/dL   Albumin 4.1 3.6 - 5.1 g/dL    Globulin 2.8 1.9 - 3.7 g/dL (calc)   AG Ratio 1.5 1.0 - 2.5 (calc)   Total Bilirubin 0.4 0.2 - 1.2 mg/dL   Alkaline phosphatase (APISO) 60 35 - 144 U/L   AST 18 10 - 35 U/L   ALT 18 9 - 46 U/L  Lipid panel  Result Value Ref Range   Cholesterol 210 (H) <200 mg/dL   HDL 48 > OR = 40 mg/dL   Triglycerides 134 <150 mg/dL   LDL Cholesterol (Calc) 136 (H) mg/dL (calc)   Total CHOL/HDL Ratio 4.4 <5.0 (calc)   Non-HDL Cholesterol (Calc) 162 (H) <130 mg/dL (calc)  Hemoglobin A1c  Result Value Ref Range   Hgb A1c MFr Bld 5.9 (H) <5.7 % of total Hgb   Mean Plasma Glucose 123 mg/dL   eAG (mmol/L) 6.8 mmol/L  PSA  Result Value Ref Range   PSA 1.76 <  OR = 4.00 ng/mL      Assessment & Plan:   Problem List Items Addressed This Visit     Chronic shoulder pain   Relevant Medications   methylPREDNISolone acetate (DEPO-MEDROL) injection 40 mg (Start on 01/06/2022  3:45 PM)   lidocaine (PF) (XYLOCAINE) 1 % injection 4 mL (Start on 01/06/2022  3:45 PM)   Other Visit Diagnoses     Nontraumatic incomplete tear of right rotator cuff    -  Primary   Relevant Medications   methylPREDNISolone acetate (DEPO-MEDROL) injection 40 mg (Start on 01/06/2022  3:45 PM)   lidocaine (PF) (XYLOCAINE) 1 % injection 4 mL (Start on 01/06/2022  3:45 PM)   Rotator cuff tendinitis, right       Relevant Medications   methylPREDNISolone acetate (DEPO-MEDROL) injection 40 mg (Start on 01/06/2022  3:45 PM)   lidocaine (PF) (XYLOCAINE) 1 % injection 4 mL (Start on 01/06/2022  3:45 PM)       Consistent with chronic R shoulder pain, w/ history of bilateral Reviewed documentation from past several visits showing some bilateral partial thickness rotator cuff tears on prior MRI done earlier this year 10/2021  Currently with flare of bursitis as well related to this, constellation of symptoms  PCP referred him to orthopedics last visit after MRI but he could not schedule due to payment due before could be seen w/ outstanding  balance.  Plan: Right shoulder subacromial steroid injection performed today, see procedure note for details. Tolerated well with significant improvement after injection  He will now contact Ortho and pay bill and proceed w/ scheduling apt to proceed  Relative rest but keep shoulder mobile, demonstrated ROM exercises, avoid heavy lifting May try heating pad PRN   Meds ordered this encounter  Medications   methylPREDNISolone acetate (DEPO-MEDROL) injection 40 mg   lidocaine (PF) (XYLOCAINE) 1 % injection 4 mL      Follow up plan: Return if symptoms worsen or fail to improve.   Nobie Putnam, [facility] 01/06/2022, 3:27 PM

## 2022-02-24 ENCOUNTER — Ambulatory Visit (INDEPENDENT_AMBULATORY_CARE_PROVIDER_SITE_OTHER): Payer: 59

## 2022-02-24 DIAGNOSIS — Z23 Encounter for immunization: Secondary | ICD-10-CM

## 2022-02-25 ENCOUNTER — Other Ambulatory Visit: Payer: Self-pay | Admitting: Internal Medicine

## 2022-02-26 ENCOUNTER — Ambulatory Visit: Payer: 59 | Admitting: Internal Medicine

## 2022-02-26 NOTE — Telephone Encounter (Signed)
Requested Prescriptions  Pending Prescriptions Disp Refills  . atorvastatin (LIPITOR) 10 MG tablet [Pharmacy Med Name: ATORVASTATIN 10 MG TABLET] 90 tablet 0    Sig: TAKE 1 TABLET BY MOUTH EVERY DAY     Cardiovascular:  Antilipid - Statins Failed - 02/25/2022  2:00 AM      Failed - Lipid Panel in normal range within the last 12 months    Cholesterol  Date Value Ref Range Status  11/27/2021 210 (H) <200 mg/dL Final   LDL Cholesterol (Calc)  Date Value Ref Range Status  11/27/2021 136 (H) mg/dL (calc) Final    Comment:    Reference range: <100 . Desirable range <100 mg/dL for primary prevention;   <70 mg/dL for patients with CHD or diabetic patients  with > or = 2 CHD risk factors. Marland Kitchen LDL-C is now calculated using the Martin-Hopkins  calculation, which is a validated novel method providing  better accuracy than the Friedewald equation in the  estimation of LDL-C.  Cresenciano Genre et al. Annamaria Helling. 8938;101(75): 2061-2068  (http://education.[facility].com/faq/FAQ164)    HDL  Date Value Ref Range Status  11/27/2021 48 > OR = 40 mg/dL Final   Triglycerides  Date Value Ref Range Status  11/27/2021 134 <150 mg/dL Final         Passed - Patient is not pregnant      Passed - Valid encounter within last 12 months    Recent Outpatient Visits          1 month ago Nontraumatic incomplete tear of right rotator cuff   [place], DO   3 months ago Encounter for general adult medical examination with abnormal findings   [facility] [place], Coralie Keens, NP   5 months ago Chronic pain of both shoulders   [facility] [place], [place], [place]   11 months ago Chronic pain of both shoulders   [facility] [place], Coralie Keens, NP   1 year ago Chronic pain of both shoulders   [facility] [place], Coralie Keens, NP      Future Appointments            In 1 week [place], Coralie Keens, NP [facility], [place]   In 3 months [place], Coralie Keens, NP [facility], [facility]

## 2022-03-09 ENCOUNTER — Ambulatory Visit: Payer: 59 | Admitting: Internal Medicine

## 2022-03-09 NOTE — Progress Notes (Deleted)
Subjective:    Patient ID: Leonard Henderson, male    DOB: 26-Jul-1971, 51 y.o.   MRN: [id number]  HPI  Patient presents to clinic today for 38-monthfollow-up of HLD and prediabetes.  HLD: His last LDL was 136, triglycerides 134, 11/2021.  He denies myalgias on Atorvastatin.  He does not consume a low-fat diet.  Prediabetes: His last A1c was 5.9%, 11/2021.  He is not taking any oral diabetic medication at this time.  He does not check his sugars.  Review of Systems  Past Medical History:  Diagnosis Date   Medical history non-contributory     Current Outpatient Medications  Medication Sig Dispense Refill   atorvastatin (LIPITOR) 10 MG tablet TAKE 1 TABLET BY MOUTH EVERY DAY 90 tablet 0   Cod Liver Oil 1000 MG CAPS Take by mouth.     ergocalciferol (VITAMIN D2) 1.25 MG (50000 UT) capsule Take 1 capsule by mouth once a week.     gabapentin (NEURONTIN) 300 MG capsule Take 3 capsules (900 mg total) by mouth at bedtime as needed. 270 capsule 0   methocarbamol (ROBAXIN) 750 MG tablet Take 1 tablet (750 mg total) by mouth at bedtime as needed for muscle spasms. 90 tablet 1   No current facility-administered medications for this visit.    No Known Allergies  Family History  Problem Relation Age of Onset   Hypertension Mother    Prostate cancer Father [phone]  Hypertension Brother    Breast cancer Sister 415   Social History   Socioeconomic History   Marital status: Married    Spouse name: Not on file   Number of children: 3   Years of education: Not on file   Highest education level: High school graduate  Occupational History   Occupation: cabinets    Comment: [employer]  Tobacco Use   Smoking status: Never   Smokeless tobacco: Never  Vaping Use   Vaping Use: Never used  Substance and Sexual Activity   Alcohol use: Not Currently    Comment: on weekends   Drug use: Yes    Types: Marijuana    Comment: 2x per month over last 10 years   Sexual activity: Yes    Birth  control/protection: None  Other Topics Concern   Not on file  Social History Narrative   ** Merged History Encounter **       Social Determinants of Health   Financial Resource Strain: Not on file  Food Insecurity: Not on file  Transportation Needs: Not on file  Physical Activity: Not on file  Stress: Not on file  Social Connections: Not on file  Intimate Partner Violence: Not on file     Constitutional: Denies fever, malaise, fatigue, headache or abrupt weight changes.  HEENT: Denies eye pain, eye redness, ear pain, ringing in the ears, wax buildup, runny nose, nasal congestion, bloody nose, or sore throat. Respiratory: Denies difficulty breathing, shortness of breath, cough or sputum production.   Cardiovascular: Denies chest pain, chest tightness, palpitations or swelling in the hands or feet.  Gastrointestinal: Denies abdominal pain, bloating, constipation, diarrhea or blood in the stool.  GU: Denies urgency, frequency, pain with urination, burning sensation, blood in urine, odor or discharge. Musculoskeletal: Patient reports chronic shoulder pain.  Denies decrease in range of motion, difficulty with gait, muscle pain or joint swelling.  Skin: Denies redness, rashes, lesions or ulcercations.  Neurological: Denies dizziness, difficulty with memory, difficulty with speech or problems with balance and  coordination.  Psych: Denies anxiety, depression, SI/HI.  No other specific complaints in a complete review of systems (except as listed in HPI above).     Objective:   Physical Exam  There were no vitals taken for this visit. Wt Readings from Last 3 Encounters:  01/06/22 153 lb (69.4 kg)  11/27/21 155 lb (70.3 kg)  09/18/21 155 lb (70.3 kg)    General: Appears their stated age, well developed, well nourished in NAD. Skin: Warm, dry and intact. No rashes, lesions or ulcerations noted. HEENT: Head: normal shape and size; Eyes: sclera white, no icterus, conjunctiva pink,  PERRLA and EOMs intact; Ears: Tm's gray and intact, normal light reflex; Nose: mucosa pink and moist, septum midline; Throat/Mouth: Teeth present, mucosa pink and moist, no exudate, lesions or ulcerations noted.  Neck:  Neck supple, trachea midline. No masses, lumps or thyromegaly present.  Cardiovascular: Normal rate and rhythm. S1,S2 noted.  No murmur, rubs or gallops noted. No JVD or BLE edema. No carotid bruits noted. Pulmonary/Chest: Normal effort and positive vesicular breath sounds. No respiratory distress. No wheezes, rales or ronchi noted.  Abdomen: Soft and nontender. Normal bowel sounds. No distention or masses noted. Liver, spleen and kidneys non palpable. Musculoskeletal: Normal range of motion. No signs of joint swelling. No difficulty with gait.  Neurological: Alert and oriented. Cranial nerves II-XII grossly intact. Coordination normal.  Psychiatric: Mood and affect normal. Behavior is normal. Judgment and thought content normal.    BMET    Component Value Date/Time   NA 143 11/27/2021 0906   K 4.7 11/27/2021 0906   CL 106 11/27/2021 0906   CO2 30 11/27/2021 0906   GLUCOSE 66 11/27/2021 0906   BUN 17 11/27/2021 0906   CREATININE 1.25 11/27/2021 0906   CALCIUM 9.7 11/27/2021 0906   GFRNONAA >60 08/20/2020 0516   GFRNONAA 95 10/05/2019 1036   GFRAA 111 10/05/2019 1036    Lipid Panel     Component Value Date/Time   CHOL 210 (H) 11/27/2021 0906   TRIG 134 11/27/2021 0906   HDL 48 11/27/2021 0906   CHOLHDL 4.4 11/27/2021 0906   LDLCALC 136 (H) 11/27/2021 0906    CBC    Component Value Date/Time   WBC 6.4 11/27/2021 0906   RBC 4.94 11/27/2021 0906   HGB 14.8 11/27/2021 0906   HCT 45.0 11/27/2021 0906   PLT 275 11/27/2021 0906   MCV 91.1 11/27/2021 0906   MCH 30.0 11/27/2021 0906   MCHC 32.9 11/27/2021 0906   RDW 12.3 11/27/2021 0906   LYMPHSABS 1.8 08/19/2020 0826   MONOABS 0.8 08/19/2020 0826   EOSABS 0.0 08/19/2020 0826   BASOSABS 0.0 08/19/2020 0826     Hgb A1C Lab Results  Component Value Date   HGBA1C 5.9 (H) 11/27/2021            Assessment & Plan:     RTC in 3 months, follow-up chronic conditions Webb Silversmith, NP

## 2022-05-24 ENCOUNTER — Other Ambulatory Visit: Payer: Self-pay | Admitting: Internal Medicine

## 2022-05-26 NOTE — Telephone Encounter (Signed)
Requested Prescriptions  Pending Prescriptions Disp Refills  . atorvastatin (LIPITOR) 10 MG tablet [Pharmacy Med Name: ATORVASTATIN 10 MG TABLET] 90 tablet 1    Sig: TAKE 1 TABLET BY MOUTH EVERY DAY     Cardiovascular:  Antilipid - Statins Failed - 05/24/2022  9:28 AM      Failed - Lipid Panel in normal range within the last 12 months    Cholesterol  Date Value Ref Range Status  11/27/2021 210 (H) <200 mg/dL Final   LDL Cholesterol (Calc)  Date Value Ref Range Status  11/27/2021 136 (H) mg/dL (calc) Final    Comment:    Reference range: <100 . Desirable range <100 mg/dL for primary prevention;   <70 mg/dL for patients with CHD or diabetic patients  with > or = 2 CHD risk factors. Marland Kitchen LDL-C is now calculated using the Martin-Hopkins  calculation, which is a validated novel method providing  better accuracy than the Friedewald equation in the  estimation of LDL-C.  Cresenciano Genre et al. Annamaria Helling. 5597;416(38): 2061-2068  (http://education.[facility].com/faq/FAQ164)    HDL  Date Value Ref Range Status  11/27/2021 48 > OR = 40 mg/dL Final   Triglycerides  Date Value Ref Range Status  11/27/2021 134 <150 mg/dL Final         Passed - Patient is not pregnant      Passed - Valid encounter within last 12 months    Recent Outpatient Visits          4 months ago Nontraumatic incomplete tear of right rotator cuff   [place], DO   6 months ago Encounter for general adult medical examination with abnormal findings   [facility] [place], Coralie Keens, NP   8 months ago Chronic pain of both shoulders   [facility] [place], Coralie Keens, NP   1 year ago Chronic pain of both shoulders   [facility] [place], Coralie Keens, NP   1 year ago Chronic pain of both shoulders   [facility] [place], Coralie Keens, NP      Future Appointments            In 1 week Garnette Gunner, Coralie Keens, NP [facility], [facility]

## 2022-06-02 ENCOUNTER — Encounter: Payer: Self-pay | Admitting: Internal Medicine

## 2022-06-02 ENCOUNTER — Ambulatory Visit (INDEPENDENT_AMBULATORY_CARE_PROVIDER_SITE_OTHER): Payer: 59 | Admitting: Internal Medicine

## 2022-06-02 VITALS — BP 126/82 | HR 51 | Temp 96.4°F | Wt 153.0 lb

## 2022-06-02 DIAGNOSIS — G8929 Other chronic pain: Secondary | ICD-10-CM | POA: Diagnosis not present

## 2022-06-02 DIAGNOSIS — R7303 Prediabetes: Secondary | ICD-10-CM | POA: Diagnosis not present

## 2022-06-02 DIAGNOSIS — E782 Mixed hyperlipidemia: Secondary | ICD-10-CM | POA: Diagnosis not present

## 2022-06-02 DIAGNOSIS — G608 Other hereditary and idiopathic neuropathies: Secondary | ICD-10-CM | POA: Diagnosis not present

## 2022-06-02 DIAGNOSIS — M25512 Pain in left shoulder: Secondary | ICD-10-CM

## 2022-06-02 DIAGNOSIS — M25511 Pain in right shoulder: Secondary | ICD-10-CM | POA: Diagnosis not present

## 2022-06-02 NOTE — Assessment & Plan Note (Signed)
Continue gabapentin He no longer plans to follow-up with neurology

## 2022-06-02 NOTE — Patient Instructions (Signed)

## 2022-06-02 NOTE — Assessment & Plan Note (Signed)
Continue gabapentin and methocarbamol as prescribed

## 2022-06-02 NOTE — Assessment & Plan Note (Signed)
C-Met and lipid profile today Encouraged him to consume a low-fat diet May need to discuss restarting atorvastatin based on labs

## 2022-06-02 NOTE — Progress Notes (Signed)
Subjective:    Patient ID: Leonard Henderson, male    DOB: May 08, 1971, 51 y.o.   MRN: [id number]  HPI  Patient presents to clinic today for follow-up of chronic conditions.  HLD: His last LDL was 136, triglycerides 134, 11/2021.  He is not taking his Atorvastatin.  He does not consume a low-fat diet.  Prediabetes: His last A1c was 5.9%, 11/2021.  He is not taking any oral diabetic medication at this time.  He does not check his sugars.  Chronic Shoulder Pain: Managed with Gabapentin and Methocarbamol.  He does not follow with orthopedics.  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  He follows with neurology.  Review of Systems     Past Medical History:  Diagnosis Date   Medical history non-contributory     Current Outpatient Medications  Medication Sig Dispense Refill   atorvastatin (LIPITOR) 10 MG tablet TAKE 1 TABLET BY MOUTH EVERY DAY 90 tablet 1   Cod Liver Oil 1000 MG CAPS Take by mouth.     ergocalciferol (VITAMIN D2) 1.25 MG (50000 UT) capsule Take 1 capsule by mouth once a week.     gabapentin (NEURONTIN) 300 MG capsule Take 3 capsules (900 mg total) by mouth at bedtime as needed. 270 capsule 0   methocarbamol (ROBAXIN) 750 MG tablet Take 1 tablet (750 mg total) by mouth at bedtime as needed for muscle spasms. 90 tablet 1   No current facility-administered medications for this visit.    No Known Allergies  Family History  Problem Relation Age of Onset   Hypertension Mother    Prostate cancer Father 17   Hypertension Brother    Breast cancer Sister 37    Social History   Socioeconomic History   Marital status: Married    Spouse name: Not on file   Number of children: 3   Years of education: Not on file   Highest education level: High school graduate  Occupational History   Occupation: cabinets    Comment: [employer]  Tobacco Use   Smoking status: Never   Smokeless tobacco: Never  Vaping Use   Vaping Use: Never  used  Substance and Sexual Activity   Alcohol use: Not Currently    Comment: on weekends   Drug use: Yes    Types: Marijuana    Comment: 2x per month over last 10 years   Sexual activity: Yes    Birth control/protection: None  Other Topics Concern   Not on file  Social History Narrative   ** Merged History Encounter **       Social Determinants of Health   Financial Resource Strain: Not on file  Food Insecurity: Not on file  Transportation Needs: Not on file  Physical Activity: Not on file  Stress: Not on file  Social Connections: Not on file  Intimate Partner Violence: Not on file     Constitutional: Denies fever, malaise, fatigue, headache or abrupt weight changes.  HEENT: Denies eye pain, eye redness, ear pain, ringing in the ears, wax buildup, runny nose, nasal congestion, bloody nose, or sore throat. Respiratory: Denies difficulty breathing, shortness of breath, cough or sputum production.   Cardiovascular: Denies chest pain, chest tightness, palpitations or swelling in the hands or feet.  Gastrointestinal: Denies abdominal pain, bloating, constipation, diarrhea or blood in the stool.  GU: Denies urgency, frequency, pain with urination, burning sensation, blood in urine, odor or discharge. Musculoskeletal: Patient reports chronic shoulder pain.  Denies decrease in  range of motion, difficulty with gait, muscle pain or joint swelling.  Skin: Denies redness, rashes, lesions or ulcercations.  Neurological: Patient reports neuropathic pain, difficulty with balance.  Denies dizziness, difficulty with memory, difficulty with speech or problems with coordination.  Psych: Denies anxiety, depression, SI/HI.  No other specific complaints in a complete review of systems (except as listed in HPI above).  Objective:   Physical Exam   BP 126/82 (BP Location: Left Arm, Patient Position: Sitting, Cuff Size: Normal)   Pulse (!) 51   Temp (!) 96.4 F (35.8 C) (Temporal)   Wt 153 lb  (69.4 kg)   SpO2 100%   BMI 21.34 kg/m   Wt Readings from Last 3 Encounters:  06/02/22 153 lb (69.4 kg)  01/06/22 153 lb (69.4 kg)  11/27/21 155 lb (70.3 kg)    General: Appears his stated age, well developed, well nourished in NAD. Skin: Warm, dry and intact. Cardiovascular: Bradycardic with normal rhythm. S1,S2 noted.  No murmur, rubs or gallops noted. No JVD or BLE edema. Pulmonary/Chest: Normal effort and positive vesicular breath sounds. No respiratory distress. No wheezes, rales or ronchi noted.  Musculoskeletal: Normal internal and external rotation of bilateral shoulders.  Shoulder shrug equal.  Strength 5/5 BUE.  No difficulty with gait.  Neurological: Alert and oriented.  Sensation intact to BLE.   BMET    Component Value Date/Time   NA 143 11/27/2021 0906   K 4.7 11/27/2021 0906   CL 106 11/27/2021 0906   CO2 30 11/27/2021 0906   GLUCOSE 66 11/27/2021 0906   BUN 17 11/27/2021 0906   CREATININE 1.25 11/27/2021 0906   CALCIUM 9.7 11/27/2021 0906   GFRNONAA >60 08/20/2020 0516   GFRNONAA 95 10/05/2019 1036   GFRAA 111 10/05/2019 1036    Lipid Panel     Component Value Date/Time   CHOL 210 (H) 11/27/2021 0906   TRIG 134 11/27/2021 0906   HDL 48 11/27/2021 0906   CHOLHDL 4.4 11/27/2021 0906   LDLCALC 136 (H) 11/27/2021 0906    CBC    Component Value Date/Time   WBC 6.4 11/27/2021 0906   RBC 4.94 11/27/2021 0906   HGB 14.8 11/27/2021 0906   HCT 45.0 11/27/2021 0906   PLT 275 11/27/2021 0906   MCV 91.1 11/27/2021 0906   MCH 30.0 11/27/2021 0906   MCHC 32.9 11/27/2021 0906   RDW 12.3 11/27/2021 0906   LYMPHSABS 1.8 08/19/2020 0826   MONOABS 0.8 08/19/2020 0826   EOSABS 0.0 08/19/2020 0826   BASOSABS 0.0 08/19/2020 0826    Hgb A1C Lab Results  Component Value Date   HGBA1C 5.9 (H) 11/27/2021           Assessment & Plan:     RTC in 6 months, for your annual exam. Webb Silversmith, NP

## 2022-06-02 NOTE — Assessment & Plan Note (Signed)
A1c today Encourage low-carb diet

## 2022-06-03 ENCOUNTER — Other Ambulatory Visit: Payer: Self-pay | Admitting: *Deleted

## 2022-06-03 LAB — LIPID PANEL
Cholesterol: 228 mg/dL — ABNORMAL HIGH (ref <200)
HDL: 58 mg/dL (ref >=40)
LDL Cholesterol (Calc): 148 mg/dL (calc) — ABNORMAL HIGH
Non-HDL Cholesterol (Calc): 170 mg/dL (calc) — ABNORMAL HIGH (ref <130)
Total CHOL/HDL Ratio: 3.9 (calc) (ref <5.0)
Triglycerides: 102 mg/dL (ref <150)

## 2022-06-03 LAB — COMPLETE METABOLIC PANEL WITH GFR
AG Ratio: 1.7 (calc) (ref 1.0–2.5)
ALT: 24 U/L (ref 9–46)
AST: 20 U/L (ref 10–35)
Albumin: 4.5 g/dL (ref 3.6–5.1)
Alkaline phosphatase (APISO): 51 U/L (ref 35–144)
BUN: 16 mg/dL (ref 7–25)
CO2: 28 mmol/L (ref 20–32)
Calcium: 9.8 mg/dL (ref 8.6–10.3)
Chloride: 103 mmol/L (ref 98–110)
Creat: 1.1 mg/dL (ref 0.70–1.30)
Globulin: 2.7 g/dL (calc) (ref 1.9–3.7)
Glucose, Bld: 73 mg/dL (ref 65–99)
Potassium: 4.7 mmol/L (ref 3.5–5.3)
Sodium: 138 mmol/L (ref 135–146)
Total Bilirubin: 0.5 mg/dL (ref 0.2–1.2)
Total Protein: 7.2 g/dL (ref 6.1–8.1)
eGFR: 80 mL/min/{1.73_m2} (ref >=60)

## 2022-06-03 LAB — HEMOGLOBIN A1C
Hgb A1c MFr Bld: 5.9 % of total Hgb — ABNORMAL HIGH (ref <5.7)
Mean Plasma Glucose: 123 mg/dL
eAG (mmol/L): 6.8 mmol/L

## 2022-06-03 NOTE — Telephone Encounter (Signed)
Contacted pharmacy to check on status of medication refill due to patient reports he did not pick up and was placed back on shelf. Pharmacy staff reports refill will be able to be picked up and will notify patient

## 2022-06-03 NOTE — Telephone Encounter (Signed)
Patient requesting refill reports he did not pick up previous refill and pharmacy put back on shelf. NT contacted pharmacy and medication will be refilled and will contact patient for pick up.  Requested Prescriptions  Refused Prescriptions Disp Refills  . atorvastatin (LIPITOR) 10 MG tablet 90 tablet 1    Sig: Take 1 tablet (10 mg total) by mouth daily.     Cardiovascular:  Antilipid - Statins Failed - 06/03/2022 12:39 PM      Failed - Lipid Panel in normal range within the last 12 months    Cholesterol  Date Value Ref Range Status  06/02/2022 228 (H) <200 mg/dL Final   LDL Cholesterol (Calc)  Date Value Ref Range Status  06/02/2022 148 (H) mg/dL (calc) Final    Comment:    Reference range: <100 . Desirable range <100 mg/dL for primary prevention;   <70 mg/dL for patients with CHD or diabetic patients  with > or = 2 CHD risk factors. Marland Kitchen LDL-C is now calculated using the Martin-Hopkins  calculation, which is a validated novel method providing  better accuracy than the Friedewald equation in the  estimation of LDL-C.  Cresenciano Genre et al. Annamaria Helling. 7035;009(38): 2061-2068  (http://education.[employer].com/faq/FAQ164)    HDL  Date Value Ref Range Status  06/02/2022 58 > OR = 40 mg/dL Final   Triglycerides  Date Value Ref Range Status  06/02/2022 102 <150 mg/dL Final         Passed - Patient is not pregnant      Passed - Valid encounter within last 12 months    Recent Outpatient Visits          [facility] [place], [place] W, NP   4 months ago Nontraumatic incomplete tear of right rotator cuff   [place], DO   6 months ago Encounter for general adult medical examination with abnormal findings   [facility] [place], Coralie Keens, NP   8 months ago Chronic pain of both shoulders   [facility] [place], Coralie Keens, NP   1 year ago Chronic pain of both shoulders   [facility] [place], Coralie Keens, [place]

## 2022-06-04 ENCOUNTER — Other Ambulatory Visit: Payer: Self-pay

## 2022-06-04 MED ORDER — ATORVASTATIN CALCIUM 10 MG PO TABS: 10.0000 mg | ORAL_TABLET | Freq: Every day | ORAL | 1 refills | Status: DC

## 2022-08-20 ENCOUNTER — Ambulatory Visit (INDEPENDENT_AMBULATORY_CARE_PROVIDER_SITE_OTHER): Payer: 59 | Admitting: Internal Medicine

## 2022-08-20 ENCOUNTER — Encounter: Payer: Self-pay | Admitting: Internal Medicine

## 2022-08-20 VITALS — BP 110/80 | HR 69 | Temp 96.4°F | Wt 154.0 lb

## 2022-08-20 DIAGNOSIS — B353 Tinea pedis: Secondary | ICD-10-CM | POA: Diagnosis not present

## 2022-08-20 MED ORDER — CLOTRIMAZOLE-BETAMETHASONE 1-0.05 % EX CREA: TOPICAL_CREAM | Freq: Two times a day (BID) | CUTANEOUS | 1 refills | Status: DC

## 2022-08-20 NOTE — Patient Instructions (Signed)
Athlete's Foot  Athlete's foot (tinea pedis) is a fungal infection of the skin on your feet. It often occurs on the skin that is between or underneath your toes. It can also occur on the soles of your feet. Symptoms include itchy or white and flaky areas on the skin. The infection can spread from person to person (is contagious). It can also spread when a person's bare feet come in contact with the fungus on shower floors or on items such as shoes. Follow these instructions at home: Medicines Apply or take over-the-counter and prescription medicines only as told by your doctor. Apply your antifungal medicine as told by your doctor. Do not stop using it even if your feet start to get better. Foot care Do not scratch your feet. Keep your feet dry: Wear cotton or wool socks. Change your socks every day or if they become wet. Wear shoes that allow air to move around, such as sandals or canvas tennis shoes. Wash and dry your feet: Every day or as told by your doctor. After exercising. Including the area between your toes. General instructions Do not share any of these items that touch your feet: Towels. Shoes. Nail clippers. Other personal items. Protect your feet by wearing sandals in wet areas, such as locker rooms and shared showers. Keep all follow-up visits. If you have diabetes, keep your blood sugar under control. Contact a doctor if: You have a fever. You have swelling, pain, warmth, or redness in your foot. Your feet are not getting better with treatment. Your symptoms get worse. You have new symptoms. You have very bad pain. Summary Athlete's foot is a fungal infection of the skin on your feet. This condition is caused by a fungus that grows in warm, moist places. Symptoms include itchy or white and flaky areas on the skin. Apply your antifungal medicine as told by your doctor. Keep your feet clean and dry. This information is not intended to replace advice given to you by  your health care provider. Make sure you discuss any questions you have with your health care provider. Document Revised: 11/10/2020 Document Reviewed: 11/10/2020 Elsevier Patient Education  [place].

## 2022-08-20 NOTE — Progress Notes (Signed)
Subjective:    Patient ID: Leonard Henderson, male    DOB: Apr 09, 1971, 52 y.o.   MRN: [id number]  HPI  Patient presents to clinic today with complaint of white discoloration between his fourth and fifth toes on his left foot.  This started 2 weeks ago.  He denies any itching, burning or rash.  He has tried Lotrimin OTC with minimal relief of symptoms.  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  Review of Systems     Past Medical History:  Diagnosis Date   Medical history non-contributory     Current Outpatient Medications  Medication Sig Dispense Refill   atorvastatin (LIPITOR) 10 MG tablet Take 1 tablet (10 mg total) by mouth daily. 90 tablet 1   Cholecalciferol (VITAMIN D3 PO) Take by mouth.     Cod Liver Oil 1000 MG CAPS Take by mouth.     gabapentin (NEURONTIN) 300 MG capsule Take 3 capsules (900 mg total) by mouth at bedtime as needed. 270 capsule 0   methocarbamol (ROBAXIN) 750 MG tablet Take 1 tablet (750 mg total) by mouth at bedtime as needed for muscle spasms. 90 tablet 1   No current facility-administered medications for this visit.    No Known Allergies  Family History  Problem Relation Age of Onset   Hypertension Mother    Prostate cancer Father 66   Hypertension Brother    Breast cancer Sister 32    Social History   Socioeconomic History   Marital status: Married    Spouse name: Not on file   Number of children: 3   Years of education: Not on file   Highest education level: High school graduate  Occupational History   Occupation: cabinets    Comment: temp agency  Tobacco Use   Smoking status: Never   Smokeless tobacco: Never  Vaping Use   Vaping Use: Never used  Substance and Sexual Activity   Alcohol use: Not Currently    Comment: on weekends   Drug use: Yes    Types: Marijuana    Comment: 2x per month over last 10 years   Sexual activity: Yes    Birth control/protection: None  Other Topics Concern    Not on file  Social History Narrative   ** Merged History Encounter **       Social Determinants of Health   Financial Resource Strain: Not on file  Food Insecurity: Not on file  Transportation Needs: Not on file  Physical Activity: Not on file  Stress: Not on file  Social Connections: Not on file  Intimate Partner Violence: Not on file     Constitutional: Denies fever, malaise, fatigue, headache or abrupt weight changes.  Respiratory: Denies difficulty breathing, shortness of breath, cough or sputum production.   Cardiovascular: Denies chest pain, chest tightness, palpitations or swelling in the hands or feet.  Musculoskeletal:  Denies decrease in range of motion, difficulty with gait, muscle pain or joint swelling.  Skin: Patient reports white discoloration between fourth and fifth toes on left foot.  Denies redness, rashes, lesions or ulcercations.  Neurological: Patient reports neuropathic pain.  Denies dizziness, difficulty with memory, difficulty with speech or problems with balance and coordination.    No other specific complaints in a complete review of systems (except as listed in HPI above).  Objective:   Physical Exam  BP 110/80 (BP Location: Left Arm, Patient Position: Sitting, Cuff Size: Normal)   Pulse 69   Temp Marland Kitchen)  96.4 F (35.8 C) (Temporal)   Wt 154 lb (69.9 kg)   SpO2 98%   BMI 21.48 kg/m   Wt Readings from Last 3 Encounters:  06/02/22 153 lb (69.4 kg)  01/06/22 153 lb (69.4 kg)  11/27/21 155 lb (70.3 kg)    General: Appears his stated age, well developed, well nourished in NAD. Skin: Warm, dry and intact.  Fungal infection noted between fourth and fifth toes of his left foot. Cardiovascular: Normal rate and rhythm. S1,S2 noted.  No murmur, rubs or gallops noted.  Pulmonary/Chest: Normal effort and positive vesicular breath sounds. No respiratory distress. No wheezes, rales or ronchi noted.  Musculoskeletal: No difficulty with gait.   Neurological: Alert and oriented.    BMET    Component Value Date/Time   NA 138 06/02/2022 0907   K 4.7 06/02/2022 0907   CL 103 06/02/2022 0907   CO2 28 06/02/2022 0907   GLUCOSE 73 06/02/2022 0907   BUN 16 06/02/2022 0907   CREATININE 1.10 06/02/2022 0907   CALCIUM 9.8 06/02/2022 0907   GFRNONAA >60 08/20/2020 0516   GFRNONAA 95 10/05/2019 1036   GFRAA 111 10/05/2019 1036    Lipid Panel     Component Value Date/Time   CHOL 228 (H) 06/02/2022 0907   TRIG 102 06/02/2022 0907   HDL 58 06/02/2022 0907   CHOLHDL 3.9 06/02/2022 0907   LDLCALC 148 (H) 06/02/2022 0907    CBC    Component Value Date/Time   WBC 6.4 11/27/2021 0906   RBC 4.94 11/27/2021 0906   HGB 14.8 11/27/2021 0906   HCT 45.0 11/27/2021 0906   PLT 275 11/27/2021 0906   MCV 91.1 11/27/2021 0906   MCH 30.0 11/27/2021 0906   MCHC 32.9 11/27/2021 0906   RDW 12.3 11/27/2021 0906   LYMPHSABS 1.8 08/19/2020 0826   MONOABS 0.8 08/19/2020 0826   EOSABS 0.0 08/19/2020 0826   BASOSABS 0.0 08/19/2020 0826    Hgb A1C Lab Results  Component Value Date   HGBA1C 5.9 (H) 06/02/2022           Assessment & Plan:   Tinea Pedis:  Rx for Lotrisone twice daily x 1 to 2 weeks Encouraged him to keep the area between the toes dry but avoid rubbing if at all possible  RTC in 3 months for your annual exam Webb Silversmith, NP

## 2022-12-03 ENCOUNTER — Encounter: Payer: Self-pay | Admitting: Internal Medicine

## 2022-12-03 ENCOUNTER — Ambulatory Visit (INDEPENDENT_AMBULATORY_CARE_PROVIDER_SITE_OTHER): Payer: 59 | Admitting: Internal Medicine

## 2022-12-03 VITALS — BP 116/82 | HR 71 | Ht 71.0 in | Wt 150.0 lb

## 2022-12-03 DIAGNOSIS — Z125 Encounter for screening for malignant neoplasm of prostate: Secondary | ICD-10-CM | POA: Diagnosis not present

## 2022-12-03 DIAGNOSIS — E782 Mixed hyperlipidemia: Secondary | ICD-10-CM

## 2022-12-03 DIAGNOSIS — Z0001 Encounter for general adult medical examination with abnormal findings: Secondary | ICD-10-CM | POA: Diagnosis not present

## 2022-12-03 DIAGNOSIS — R7303 Prediabetes: Secondary | ICD-10-CM | POA: Diagnosis not present

## 2022-12-03 MED ORDER — GABAPENTIN 300 MG PO CAPS: 900.0000 mg | ORAL_CAPSULE | Freq: Every evening | ORAL | 1 refills | Status: DC | PRN

## 2022-12-03 MED ORDER — ATORVASTATIN CALCIUM 10 MG PO TABS: 10.0000 mg | ORAL_TABLET | Freq: Every day | ORAL | 1 refills | Status: DC

## 2022-12-03 NOTE — Progress Notes (Signed)
Subjective:    Patient ID: Leonard Henderson, male    DOB: [birth year], 52 y.o.   MRN: [id number]  HPI  Patient presents to clinic today for his annual exam.  Flu: never Tetanus: 08/2020 COVID: x 2 Shingrix: 11/2021, 01/2022 PSA screening: 11/2021 Colon screening: 11/2019 Vision screening: as needed Dentist: biannually  Diet: He does eat meat. He consumes fruits and veggies. He does eat some fried foods. He drinks mostly juice, water Exercise: Swimming  Review of Systems  Past Medical History:  Diagnosis Date   Medical history non-contributory     Current Outpatient Medications  Medication Sig Dispense Refill   atorvastatin (LIPITOR) 10 MG tablet Take 1 tablet (10 mg total) by mouth daily. 90 tablet 1   Cholecalciferol (VITAMIN D3 PO) Take by mouth.     clotrimazole-betamethasone (LOTRISONE) cream Apply topically 2 (two) times daily. 30 g 1   Cod Liver Oil 1000 MG CAPS Take by mouth.     gabapentin (NEURONTIN) 300 MG capsule Take 3 capsules (900 mg total) by mouth at bedtime as needed. 270 capsule 0   methocarbamol (ROBAXIN) 750 MG tablet Take 1 tablet (750 mg total) by mouth at bedtime as needed for muscle spasms. 90 tablet 1   No current facility-administered medications for this visit.    No Known Allergies  Family History  Problem Relation Age of Onset   Hypertension Mother    Prostate cancer Father 67   Hypertension Brother    Breast cancer Sister 27    Social History   Socioeconomic History   Marital status: Married    Spouse name: Not on file   Number of children: 3   Years of education: Not on file   Highest education level: High school graduate  Occupational History   Occupation: cabinets    Comment: temp agency  Tobacco Use   Smoking status: Never   Smokeless tobacco: Never  Vaping Use   Vaping Use: Never used  Substance and Sexual Activity   Alcohol use: Not Currently    Comment: on weekends   Drug use: Yes    Types: Marijuana    Comment: 2x  per month over last 10 years   Sexual activity: Yes    Birth control/protection: None  Other Topics Concern   Not on file  Social History Narrative   ** Merged History Encounter **       Social Determinants of Health   Financial Resource Strain: Not on file  Food Insecurity: Not on file  Transportation Needs: Not on file  Physical Activity: Not on file  Stress: Not on file  Social Connections: Not on file  Intimate Partner Violence: Not on file     Constitutional: Denies fever, malaise, fatigue, headache or abrupt weight changes.  HEENT: Denies eye pain, eye redness, ear pain, ringing in the ears, wax buildup, runny nose, nasal congestion, bloody nose, or sore throat. Respiratory: Denies difficulty breathing, shortness of breath, cough or sputum production.   Cardiovascular: Denies chest pain, chest tightness, palpitations or swelling in the hands or feet.  Gastrointestinal: Denies abdominal pain, bloating, constipation, diarrhea or blood in the stool.  GU: Denies urgency, frequency, pain with urination, burning sensation, blood in urine, odor or discharge. Musculoskeletal: Patient reports chronic shoulder pain.  Denies decrease in range of motion, difficulty with gait, muscle pain or joint swelling.  Skin: Denies redness, rashes, lesions or ulcercations.  Neurological: Patient reports neuropathic pain.  Denies dizziness, difficulty with memory, difficulty with speech  or problems with balance and coordination.  Psych: Denies anxiety, depression, SI/HI.  No other specific complaints in a complete review of systems (except as listed in HPI above).     Objective:   Physical Exam   BP 116/82   Pulse 71   Ht 5\' 11"  (1.803 m)   Wt 150 lb (68 kg)   SpO2 99%   BMI 20.92 kg/m   Wt Readings from Last 3 Encounters:  08/20/22 154 lb (69.9 kg)  06/02/22 153 lb (69.4 kg)  01/06/22 153 lb (69.4 kg)    General: Appears his stated age, well developed, well nourished in NAD. Skin:  Warm, dry and intact.  HEENT: Head: normal shape and size; Eyes: sclera white, no icterus, conjunctiva pink, PERRLA and EOMs intact;  Neck:  Neck supple, trachea midline. No masses, lumps or thyromegaly present.  Cardiovascular: Normal rate and rhythm. S1,S2 noted.  No murmur, rubs or gallops noted. No JVD or BLE edema. No carotid bruits noted. Pulmonary/Chest: Normal effort and positive vesicular breath sounds. No respiratory distress. No wheezes, rales or ronchi noted.  Abdomen: Normal bowel sounds. Musculoskeletal: Strength 5/5 BUE/BLE.  No difficulty with gait.  Neurological: Alert and oriented. Cranial nerves II-XII grossly intact. Coordination normal.  Psychiatric: Mood and affect normal. Behavior is normal. Judgment and thought content normal.    BMET    Component Value Date/Time   NA 138 06/02/2022 0907   K 4.7 06/02/2022 0907   CL 103 06/02/2022 0907   CO2 28 06/02/2022 0907   GLUCOSE 73 06/02/2022 0907   BUN 16 06/02/2022 0907   CREATININE 1.10 06/02/2022 0907   CALCIUM 9.8 06/02/2022 0907   GFRNONAA >60 08/20/2020 0516   GFRNONAA 95 10/05/2019 1036   GFRAA 111 10/05/2019 1036    Lipid Panel     Component Value Date/Time   CHOL 228 (H) 06/02/2022 0907   TRIG 102 06/02/2022 0907   HDL 58 06/02/2022 0907   CHOLHDL 3.9 06/02/2022 0907   LDLCALC 148 (H) 06/02/2022 0907    CBC    Component Value Date/Time   WBC 6.4 11/27/2021 0906   RBC 4.94 11/27/2021 0906   HGB 14.8 11/27/2021 0906   HCT 45.0 11/27/2021 0906   PLT 275 11/27/2021 0906   MCV 91.1 11/27/2021 0906   MCH 30.0 11/27/2021 0906   MCHC 32.9 11/27/2021 0906   RDW 12.3 11/27/2021 0906   LYMPHSABS 1.8 08/19/2020 0826   MONOABS 0.8 08/19/2020 0826   EOSABS 0.0 08/19/2020 0826   BASOSABS 0.0 08/19/2020 0826    Hgb A1C Lab Results  Component Value Date   HGBA1C 5.9 (H) 06/02/2022           Assessment & Plan:   Preventative Health Maintenance:  Encouraged him to get a flu shot in the  fall Tetanus UTD Encouraged him to get his covid booster Shingrix UTD Colon screening UTD Encouraged him to consume a balanced diet and exercise regimen Advised him to see an eye doctor and dentist annually Will check CBC, CMET, Lipid, A1C and PSA today  RTC in 6 months, follow up chronic conditions Nicki Reaper, NP

## 2022-12-03 NOTE — Patient Instructions (Signed)
Health Maintenance, Male Adopting a healthy lifestyle and getting preventive care are important in promoting health and wellness. Ask your health care provider about: The right schedule for you to have regular tests and exams. Things you can do on your own to prevent diseases and keep yourself healthy. What should I know about diet, weight, and exercise? Eat a healthy diet  Eat a diet that includes plenty of vegetables, fruits, low-fat dairy products, and lean protein. Do not eat a lot of foods that are high in solid fats, added sugars, or sodium. Maintain a healthy weight Body mass index (BMI) is a measurement that can be used to identify possible weight problems. It estimates body fat based on height and weight. Your health care provider can help determine your BMI and help you achieve or maintain a healthy weight. Get regular exercise Get regular exercise. This is one of the most important things you can do for your health. Most adults should: Exercise for at least 150 minutes each week. The exercise should increase your heart rate and make you sweat (moderate-intensity exercise). Do strengthening exercises at least twice a week. This is in addition to the moderate-intensity exercise. Spend less time sitting. Even light physical activity can be beneficial. Watch cholesterol and blood lipids Have your blood tested for lipids and cholesterol at 54 years of age, then have this test every 5 years. You may need to have your cholesterol levels checked more often if: Your lipid or cholesterol levels are high. You are older than 54 years of age. You are at high risk for heart disease. What should I know about cancer screening? Many types of cancers can be detected early and may often be prevented. Depending on your health history and family history, you may need to have cancer screening at various ages. This may include screening for: Colorectal cancer. Prostate cancer. Skin cancer. Lung  cancer. What should I know about heart disease, diabetes, and high blood pressure? Blood pressure and heart disease High blood pressure causes heart disease and increases the risk of stroke. This is more likely to develop in people who have high blood pressure readings or are overweight. Talk with your health care provider about your target blood pressure readings. Have your blood pressure checked: Every 3-5 years if you are 18-39 years of age. Every year if you are 40 years old or older. If you are between the ages of 65 and 75 and are a current or former smoker, ask your health care provider if you should have a one-time screening for abdominal aortic aneurysm (AAA). Diabetes Have regular diabetes screenings. This checks your fasting blood sugar level. Have the screening done: Once every three years after age 45 if you are at a normal weight and have a low risk for diabetes. More often and at a younger age if you are overweight or have a high risk for diabetes. What should I know about preventing infection? Hepatitis B If you have a higher risk for hepatitis B, you should be screened for this virus. Talk with your health care provider to find out if you are at risk for hepatitis B infection. Hepatitis C Blood testing is recommended for: Everyone born from 1945 through 1965. Anyone with known risk factors for hepatitis C. Sexually transmitted infections (STIs) You should be screened each year for STIs, including gonorrhea and chlamydia, if: You are sexually active and are younger than 54 years of age. You are older than 54 years of age and your   health care provider tells you that you are at risk for this type of infection. Your sexual activity has changed since you were last screened, and you are at increased risk for chlamydia or gonorrhea. Ask your health care provider if you are at risk. Ask your health care provider about whether you are at high risk for HIV. Your health care provider  may recommend a prescription medicine to help prevent HIV infection. If you choose to take medicine to prevent HIV, you should first get tested for HIV. You should then be tested every 3 months for as long as you are taking the medicine. Follow these instructions at home: Alcohol use Do not drink alcohol if your health care provider tells you not to drink. If you drink alcohol: Limit how much you have to 0-2 drinks a day. Know how much alcohol is in your drink. In the [place] one drink equals one 12 oz bottle of beer (355 mL), one 5 oz glass of wine (148 mL), or one 1 oz glass of hard liquor (44 mL). Lifestyle Do not use any products that contain nicotine or tobacco. These products include cigarettes, chewing tobacco, and vaping devices, such as e-cigarettes. If you need help quitting, ask your health care provider. Do not use street drugs. Do not share needles. Ask your health care provider for help if you need support or information about quitting drugs. General instructions Schedule regular health, dental, and eye exams. Stay current with your vaccines. Tell your health care provider if: You often feel depressed. You have ever been abused or do not feel safe at home. Summary Adopting a healthy lifestyle and getting preventive care are important in promoting health and wellness. Follow your health care provider's instructions about healthy diet, exercising, and getting tested or screened for diseases. Follow your health care provider's instructions on monitoring your cholesterol and blood pressure. This information is not intended to replace advice given to you by your health care provider. Make sure you discuss any questions you have with your health care provider. Document Revised: 12/09/2020 Document Reviewed: 12/09/2020 Elsevier Patient Education  2023 [employer]vier Inc.  

## 2022-12-04 LAB — HEMOGLOBIN A1C
Hgb A1c MFr Bld: 6 % of total Hgb — ABNORMAL HIGH (ref <5.7)
Mean Plasma Glucose: 126 mg/dL
eAG (mmol/L): 7 mmol/L

## 2022-12-04 LAB — COMPLETE METABOLIC PANEL WITH GFR
AG Ratio: 1.9 (calc) (ref 1.0–2.5)
ALT: 22 U/L (ref 9–46)
AST: 24 U/L (ref 10–35)
Albumin: 4.3 g/dL (ref 3.6–5.1)
Alkaline phosphatase (APISO): 49 U/L (ref 35–144)
BUN: 16 mg/dL (ref 7–25)
CO2: 28 mmol/L (ref 20–32)
Calcium: 9.6 mg/dL (ref 8.6–10.3)
Chloride: 105 mmol/L (ref 98–110)
Creat: 1.17 mg/dL (ref 0.70–1.30)
Globulin: 2.3 g/dL (calc) (ref 1.9–3.7)
Glucose, Bld: 96 mg/dL (ref 65–139)
Potassium: 4.5 mmol/L (ref 3.5–5.3)
Sodium: 141 mmol/L (ref 135–146)
Total Bilirubin: 0.4 mg/dL (ref 0.2–1.2)
Total Protein: 6.6 g/dL (ref 6.1–8.1)
eGFR: 75 mL/min/{1.73_m2} (ref >=60)

## 2022-12-04 LAB — CBC
HCT: 40.4 % (ref 38.5–50.0)
Hemoglobin: 13.1 g/dL — ABNORMAL LOW (ref 13.2–17.1)
MCH: 29.8 pg (ref 27.0–33.0)
MCHC: 32.4 g/dL (ref 32.0–36.0)
MCV: 92 fL (ref 80.0–100.0)
MPV: 10.9 fL (ref 7.5–12.5)
Platelets: 224 10*3/uL (ref 140–400)
RBC: 4.39 10*6/uL (ref 4.20–5.80)
RDW: 12.1 % (ref 11.0–15.0)
WBC: 4.4 10*3/uL (ref 3.8–10.8)

## 2022-12-04 LAB — LIPID PANEL
Cholesterol: 166 mg/dL (ref <200)
HDL: 59 mg/dL (ref >=40)
LDL Cholesterol (Calc): 92 mg/dL (calc)
Non-HDL Cholesterol (Calc): 107 mg/dL (calc) (ref <130)
Total CHOL/HDL Ratio: 2.8 (calc) (ref <5.0)
Triglycerides: 63 mg/dL (ref <150)

## 2022-12-04 LAB — PSA: PSA: 1.31 ng/mL (ref <=4.00)

## 2023-05-20 ENCOUNTER — Encounter: Payer: Self-pay | Admitting: Physician Assistant

## 2023-05-20 ENCOUNTER — Ambulatory Visit: Payer: Self-pay | Admitting: *Deleted

## 2023-05-20 ENCOUNTER — Ambulatory Visit: Payer: 59 | Admitting: Physician Assistant

## 2023-05-20 VITALS — BP 136/80 | HR 54 | Temp 98.0°F | Resp 16 | Ht 71.0 in | Wt 154.4 lb

## 2023-05-20 DIAGNOSIS — R2242 Localized swelling, mass and lump, left lower limb: Secondary | ICD-10-CM | POA: Diagnosis not present

## 2023-05-20 DIAGNOSIS — M79605 Pain in left leg: Secondary | ICD-10-CM

## 2023-05-20 NOTE — Telephone Encounter (Signed)
Message from [place] sent at 05/20/2023  8:11 AM EDT  Summary: swelling/tingling in leg   Patient called stated he Is experiencing swelling in left leg and tingling in right leg. He wanted to be seen today but no appts until Monday. Please f/u with patient          Call History  Contact Date/Time Type Contact Phone/Fax User  05/20/2023 08:10 AM EDT Phone (Incoming) Leonard Henderson, Leonard Henderson (Self) [phone] Rexene Edison) [place] Jester

## 2023-05-20 NOTE — Telephone Encounter (Signed)
  Chief Complaint: left leg swelling- possibly related to injury to leg Symptoms: swelling lower left leg- after blow/cut to leg at work. Patient states he has swelling from left shin to ankle.pain but no redness Frequency: 1 week Pertinent Negatives: Patient denies chest pain, difficulty breathing Disposition: [] ED /[] Urgent Care (no appt availability in office) / [x] Appointment(In office/virtual)/ []  [facility]/ [] Home Care/ [] Refused Recommended Disposition /[] [facility]/ []  Follow-up with PCP Additional Notes: No open appointment in office- appointment scheduled with float provider   Reason for Disposition  [1] MODERATE leg swelling (e.g., swelling extends up to knees) AND [2] new-onset or worsening  Answer Assessment - Initial Assessment Questions 1. ONSET: "When did the swelling start?" (e.g., minutes, hours, days)     1 week 2. LOCATION: "What part of the leg is swollen?"  "Are both legs swollen or just one leg?"     Left leg- shin- socks leave mark- more to the front left 3. SEVERITY: "How bad is the swelling?" (e.g., localized; mild, moderate, severe)   - Localized: Small area of swelling localized to one leg.   - MILD pedal edema: Swelling limited to foot and ankle, pitting edema < 1/4 inch (6 mm) deep, rest and elevation eliminate most or all swelling.   - MODERATE edema: Swelling of lower leg to knee, pitting edema > 1/4 inch (6 mm) deep, rest and elevation only partially reduce swelling.   - SEVERE edema: Swelling extends above knee, facial or hand swelling present.      mild 4. REDNESS: "Does the swelling look red or infected?"     no 5. PAIN: "Is the swelling painful to touch?" If Yes, ask: "How painful is it?"   (Scale 1-10; mild, moderate or severe)     Hit at work 2 weeks ago- hit on shin  7. CAUSE: "What do you think is causing the leg swelling?"     Possible injury 8. MEDICAL HISTORY: "Do you have a history of blood clots (e.g., DVT), cancer,  heart failure, kidney disease, or liver failure?"     no 9. RECURRENT SYMPTOM: "Have you had leg swelling before?" If Yes, ask: "When was the last time?" "What happened that time?"     no 10. OTHER SYMPTOMS: "Do you have any other symptoms?" (e.g., chest pain, difficulty breathing)       no  Protocols used: Leg Swelling and Edema-A-AH

## 2023-05-20 NOTE — Telephone Encounter (Signed)
Attempted to return his call.   The voice mailbox is full and unable to take messages at this time.   Will try again later.

## 2023-05-20 NOTE — Telephone Encounter (Signed)
Summary: swelling/tingling in leg   Patient called stated he Is experiencing swelling in left leg and tingling in right leg. He wanted to be seen today but no appts until Monday. Please f/u with patient    Called pt - call went straight to VM. Unable to Leave message  - voice mail is full.

## 2023-05-20 NOTE — Patient Instructions (Signed)
For now I recommend taking the Meloxicam once per day  Do not take other NSAIDs with this (ibuprofen, motrin, advil, aleve, etc)  You can take Tylenol as needed throughout the day for pain You can take up to 3500 mg of tylenol per 24 hours  Please try to wear compression stockings to help with swelling and discomfort  Use your shoe size and then measure around the widest part of your calf to get the appropriate size stocking I typically recommend 15-25 mmHg compression force for daily wear. Please put these on first thing in the morning and wear until the evening time.  These should not be painful or cut into your legs but they may be pretty snug. If they cause pain, please take them off.   You can also try warm compresses to the area, gentle massage, and stretches as tolerated Please let us know if you have further questions or concerns

## 2023-05-20 NOTE — Progress Notes (Signed)
Acute Office Visit   Patient: Leonard Henderson   DOB: May 08, 1971   52 y.o. Male  MRN: [id number] Visit Date: 05/20/2023  Today's healthcare provider: Oswaldo Conroy Kandra Graven, PA-C  Introduced myself to the patient as a Secondary school teacher and provided education on APPs in clinical practice.    Chief Complaint  Patient presents with   Leg Swelling    Left leg 2 weeks, pt states a metal piece fell on his leg   Subjective    HPI HPI     Leg Swelling    Additional comments: Left leg 2 weeks, pt states a metal piece fell on his leg      Last edited by Forde Radon, CMA on 05/20/2023 11:10 AM.       Left leg  Ongoing for 2 weeks  A piece of metal fell on his leg which seemed to initiate symptoms He initially had swelling along shin but now the whole leg circumference is swollen and he has swelling in ankle and foot  Onset: sudden after injury  Duration: 2 weeks  Location: left lower leg  Radiation: to ankle and foot  Pain level and character: 6/10 achy pain  Other associated symptoms:sometimes has some numbness - chronic but has been aggravated by injury  Interventions: Meloxicam provided good relief  Alleviating: Meloxicam Aggravating: reports shoes sometimes impact pain     Medications: Outpatient Medications Prior to Visit  Medication Sig   atorvastatin (LIPITOR) 10 MG tablet Take 1 tablet (10 mg total) by mouth daily.   Cholecalciferol (VITAMIN D3 PO) Take by mouth.   clotrimazole-betamethasone (LOTRISONE) cream Apply topically 2 (two) times daily.   Cod Liver Oil 1000 MG CAPS Take by mouth.   Cyanocobalamin (B-12 PO) Take by mouth.   gabapentin (NEURONTIN) 300 MG capsule Take 3 capsules (900 mg total) by mouth at bedtime as needed.   meloxicam (MOBIC) 15 MG tablet Take 1 tablet by mouth daily.   methocarbamol (ROBAXIN) 750 MG tablet Take 1 tablet (750 mg total) by mouth at bedtime as needed for muscle spasms.   No facility-administered medications prior to visit.     Review of Systems  Respiratory:  Negative for shortness of breath and wheezing.   Cardiovascular:  Positive for leg swelling. Negative for chest pain and palpitations.  Musculoskeletal:  Positive for myalgias.        Objective    BP 136/80   Pulse (!) 54   Temp 98 F (36.7 C) (Oral)   Resp 16   Ht 5\' 11"  (1.803 m)   Wt 154 lb 6.4 oz (70 kg)   SpO2 98%   BMI 21.53 kg/m     Physical Exam Vitals reviewed.  Constitutional:      General: He is awake.     Appearance: Normal appearance. He is well-developed and well-groomed.  HENT:     Head: Normocephalic and atraumatic.  Pulmonary:     Effort: Pulmonary effort is normal.  Musculoskeletal:     Left lower leg: Tenderness present. No deformity or lacerations. Edema present.     Right ankle: Normal range of motion.     Left ankle: No tenderness. Normal range of motion. Anterior drawer test negative.     Comments: Left lower leg is supple with mild swelling along lower aspect of lateral shin  Calves are both 13 in in diameter  Mild swelling to left ankle compared to right   Neurological:  Mental Status: He is alert.  Psychiatric:        Attention and Perception: Attention and perception normal.        Mood and Affect: Mood and affect normal.        Speech: Speech normal.        Behavior: Behavior normal. Behavior is cooperative.       No results found for any visits on 05/20/23.  Assessment & Plan      No follow-ups on file.       Problem List Items Addressed This Visit   None Visit Diagnoses     Localized swelling of left lower extremity    -  Primary Acute, ongoing Patient reports recent injury to skin of left leg with associated swelling and healing wound for the past 2 weeks Mild swelling noted along mid shin and healing laceration without drainage or erythema noted  Reviewed that wound healing appears normal and there can be secondary swelling to the area for a bit after such trauma Offered  imaging for osseous injury rule out - declined  Recommend taking Meloxicam PO every day and can use Tylenol throughout the day as needed  Can also use warm compresses and gentle massage to help with relief Follow up as needed for persistent or progressing symptoms     Left leg pain            No follow-ups on file.   I, Shelise Maron E Jaynia Fendley, PA-C, have reviewed all documentation for this visit. The documentation on 05/20/23 for the exam, diagnosis, procedures, and orders are all accurate and complete.   Jacquelin Hawking, MHS, PA-C [facility]

## 2023-06-07 ENCOUNTER — Encounter: Payer: Self-pay | Admitting: Internal Medicine

## 2023-06-07 ENCOUNTER — Ambulatory Visit: Payer: 59 | Admitting: Internal Medicine

## 2023-06-07 VITALS — BP 120/78 | HR 64 | Resp 16 | Ht 71.0 in | Wt 150.8 lb

## 2023-06-07 DIAGNOSIS — M25511 Pain in right shoulder: Secondary | ICD-10-CM

## 2023-06-07 DIAGNOSIS — E782 Mixed hyperlipidemia: Secondary | ICD-10-CM | POA: Diagnosis not present

## 2023-06-07 DIAGNOSIS — G8929 Other chronic pain: Secondary | ICD-10-CM | POA: Diagnosis not present

## 2023-06-07 DIAGNOSIS — M25512 Pain in left shoulder: Secondary | ICD-10-CM

## 2023-06-07 DIAGNOSIS — R7303 Prediabetes: Secondary | ICD-10-CM | POA: Diagnosis not present

## 2023-06-07 DIAGNOSIS — G608 Other hereditary and idiopathic neuropathies: Secondary | ICD-10-CM

## 2023-06-07 MED ORDER — GABAPENTIN 300 MG PO CAPS: 300.0000 mg | ORAL_CAPSULE | Freq: Two times a day (BID) | ORAL | 1 refills | Status: DC

## 2023-06-07 NOTE — Patient Instructions (Signed)
Prediabetes Eating Plan Prediabetes is a condition that causes blood sugar (glucose) levels to be higher than normal. This increases the risk for developing type 2 diabetes (type 2 diabetes mellitus). Working with a health care provider or nutrition specialist (dietitian) to make diet and lifestyle changes can help prevent the onset of diabetes. These changes may help you: Control your blood glucose levels. Improve your cholesterol levels. Manage your blood pressure. What are tips for following this plan? Reading food labels Read food labels to check the amount of fat, salt (sodium), and sugar in prepackaged foods. Avoid foods that have: Saturated fats. Trans fats. Added sugars. Avoid foods that have more than 300 milligrams (mg) of sodium per serving. Limit your sodium intake to less than 2,300 mg each day. Shopping Avoid buying pre-made and processed foods. Avoid buying drinks with added sugar. Cooking Cook with olive oil. Do not use butter, lard, or ghee. Bake, broil, grill, steam, or boil foods. Avoid frying. Meal planning  Work with your dietitian to create an eating plan that is right for you. This may include tracking how many calories you take in each day. Use a food diary, notebook, or mobile application to track what you eat at each meal. Consider following a Mediterranean diet. This includes: Eating several servings of fresh fruits and vegetables each day. Eating fish at least twice a week. Eating one serving each day of whole grains, beans, nuts, and seeds. Using olive oil instead of other fats. Limiting alcohol. Limiting red meat. Using nonfat or low-fat dairy products. Consider following a plant-based diet. This includes dietary choices that focus on eating mostly vegetables and fruit, grains, beans, nuts, and seeds. If you have high blood pressure, you may need to limit your sodium intake or follow a diet such as the DASH (Dietary Approaches to Stop Hypertension) eating  plan. The DASH diet aims to lower high blood pressure. Lifestyle Set weight loss goals with help from your health care team. It is recommended that most people with prediabetes lose 7% of their body weight. Exercise for at least 30 minutes 5 or more days a week. Attend a support group or seek support from a mental health counselor. Take over-the-counter and prescription medicines only as told by your health care provider. What foods are recommended? Fruits Berries. Bananas. Apples. Oranges. Grapes. Papaya. Mango. Pomegranate. Kiwi. Grapefruit. Cherries. Vegetables Lettuce. Spinach. Peas. Beets. Cauliflower. Cabbage. Broccoli. Carrots. Tomatoes. Squash. Eggplant. Herbs. Peppers. Onions. Cucumbers. Brussels sprouts. Grains Whole grains, such as whole-wheat or whole-grain breads, crackers, cereals, and pasta. Unsweetened oatmeal. Bulgur. Barley. Quinoa. Brown rice. Corn or whole-wheat flour tortillas or taco shells. Meats and other proteins Seafood. Poultry without skin. Lean cuts of pork and beef. Tofu. Eggs. Nuts. Beans. Dairy Low-fat or fat-free dairy products, such as yogurt, cottage cheese, and cheese. Beverages Water. Tea. Coffee. Sugar-free or diet soda. Seltzer water. Low-fat or nonfat milk. Milk alternatives, such as soy or almond milk. Fats and oils Olive oil. Canola oil. Sunflower oil. Grapeseed oil. Avocado. Walnuts. Sweets and desserts Sugar-free or low-fat pudding. Sugar-free or low-fat ice cream and other frozen treats. Seasonings and condiments Herbs. Sodium-free spices. Mustard. Relish. Low-salt, low-sugar ketchup. Low-salt, low-sugar barbecue sauce. Low-fat or fat-free mayonnaise. The items listed above may not be a complete list of recommended foods and beverages. Contact a dietitian for more information. What foods are not recommended? Fruits Fruits canned with syrup. Vegetables Canned vegetables. Frozen vegetables with butter or cream sauce. Grains Refined white  flour and flour   products, such as bread, pasta, snack foods, and cereals. Meats and other proteins Fatty cuts of meat. Poultry with skin. Breaded or fried meat. Processed meats. Dairy Full-fat yogurt, cheese, or milk. Beverages Sweetened drinks, such as iced tea and soda. Fats and oils Butter. Lard. Ghee. Sweets and desserts Baked goods, such as cake, cupcakes, pastries, cookies, and cheesecake. Seasonings and condiments Spice mixes with added salt. Ketchup. Barbecue sauce. Mayonnaise. The items listed above may not be a complete list of foods and beverages that are not recommended. Contact a dietitian for more information. Where to find more information American Diabetes Association: www.diabetes.org Summary You may need to make diet and lifestyle changes to help prevent the onset of diabetes. These changes can help you control blood sugar, improve cholesterol levels, and manage blood pressure. Set weight loss goals with help from your health care team. It is recommended that most people with prediabetes lose 7% of their body weight. Consider following a Mediterranean diet. This includes eating plenty of fresh fruits and vegetables, whole grains, beans, nuts, seeds, fish, and low-fat dairy, and using olive oil instead of other fats. This information is not intended to replace advice given to you by your health care provider. Make sure you discuss any questions you have with your health care provider. Document Revised: 10/19/2019 Document Reviewed: 10/19/2019 Elsevier Patient Education  2024 Else[employer] Inc.  

## 2023-06-07 NOTE — Assessment & Plan Note (Signed)
Continue meloxicam, methocarbamol and gabapentin as needed

## 2023-06-07 NOTE — Progress Notes (Signed)
Subjective:    Patient ID: Leonard Henderson, male    DOB: 01/13/1971, 52 y.o.   MRN: [id number]  HPI  Pt presents to the clinic today for 6 month followup of chronic conditions.  HLD: His last LDL was 92, triglycerides 63, 12/2022. He is taking atorvastatin as prescribed. He does not consume a low fat diet.  Prediabetes: His last A1C was 6%, 12/2022. He is not taking any oral diabetic medication at this time. He does not check his sugars.  Chronic shoulder pain: He reports this has been better lately. MRI of shoulders from 10/2021 reviewed. Managed with gabapentin, meloxicam and methocarbamol. He does not follow with orthopedics.  Sensory polyneuropathy, ataxia s/p head injury: Managed with gabapentin. He follows with neurology.  Review of Systems     Past Medical History:  Diagnosis Date   Medical history non-contributory     Current Outpatient Medications  Medication Sig Dispense Refill   atorvastatin (LIPITOR) 10 MG tablet Take 1 tablet (10 mg total) by mouth daily. 90 tablet 1   Cholecalciferol (VITAMIN D3 PO) Take by mouth.     clotrimazole-betamethasone (LOTRISONE) cream Apply topically 2 (two) times daily. 30 g 1   Cod Liver Oil 1000 MG CAPS Take by mouth.     Cyanocobalamin (B-12 PO) Take by mouth.     gabapentin (NEURONTIN) 300 MG capsule Take 3 capsules (900 mg total) by mouth at bedtime as needed. 270 capsule 1   meloxicam (MOBIC) 15 MG tablet Take 1 tablet by mouth daily.     methocarbamol (ROBAXIN) 750 MG tablet Take 1 tablet (750 mg total) by mouth at bedtime as needed for muscle spasms. 90 tablet 1   No current facility-administered medications for this visit.    No Known Allergies  Family History  Problem Relation Age of Onset   Hypertension Mother    Prostate cancer Father 48   Hypertension Brother    Breast cancer Sister 57    Social History   Socioeconomic History   Marital status: Married    Spouse name: Not on file   Number of children: 3    Years of education: Not on file   Highest education level: High school graduate  Occupational History   Occupation: cabinets    Comment: [employer]  Tobacco Use   Smoking status: Never   Smokeless tobacco: Never  Vaping Use   Vaping status: Never Used  Substance and Sexual Activity   Alcohol use: Not Currently    Comment: on weekends   Drug use: Yes    Types: Marijuana    Comment: 2x per month over last 10 years   Sexual activity: Yes    Birth control/protection: None  Other Topics Concern   Not on file  Social History Narrative   ** Merged History Encounter **       Social Determinants of Health   Financial Resource Strain: Not on file  Food Insecurity: Not on file  Transportation Needs: Not on file  Physical Activity: Not on file  Stress: Not on file  Social Connections: Not on file  Intimate Partner Violence: Not on file     Constitutional: Denies fever, malaise, fatigue, headache or abrupt weight changes.  HEENT: Denies eye pain, eye redness, ear pain, ringing in the ears, wax buildup, runny nose, nasal congestion, bloody nose, or sore throat. Respiratory: Denies difficulty breathing, shortness of breath, cough or sputum production.   Cardiovascular: Denies chest pain, chest tightness, palpitations or swelling in  the hands or feet.  Gastrointestinal: Denies abdominal pain, bloating, constipation, diarrhea or blood in the stool.  GU: Denies urgency, frequency, pain with urination, burning sensation, blood in urine, odor or discharge. Musculoskeletal: Pt reports intermittent shoulder pain. Denies decrease in range of motion, difficulty with gait, muscle pain or joint swelling.  Skin: Denies redness, rashes, lesions or ulcercations.  Neurological: Pt reports neuropathic pain. Denies dizziness, difficulty with memory, difficulty with speech or problems with balance and coordination.  Psych: Denies anxiety, depression, SI/HI.  No other specific complaints in a complete  review of systems (except as listed in HPI above).  Objective:   Physical Exam  BP 120/78 (BP Location: Left Arm, Patient Position: Sitting, Cuff Size: Normal)   Pulse 64   Resp 16   Ht 5\' 11"  (1.803 m)   Wt 150 lb 12.8 oz (68.4 kg)   SpO2 98%   BMI 21.03 kg/m   Wt Readings from Last 3 Encounters:  05/20/23 154 lb 6.4 oz (70 kg)  12/03/22 150 lb (68 kg)  08/20/22 154 lb (69.9 kg)    General: Appears his stated age, well developed, well nourished in NAD. Skin: Warm, dry and intact.  HEENT: Head: normal shape and size; Eyes: sclera white, no icterus, conjunctiva pink, PERRLA and EOMs intact;  Cardiovascular: Normal rate and rhythm. S1,S2 noted.  No murmur, rubs or gallops noted. No JVD or BLE edema. No carotid bruits noted. Pulmonary/Chest: Normal effort and positive vesicular breath sounds. No respiratory distress. No wheezes, rales or ronchi noted.  Musculoskeletal: Strength 5/5 BUE/BLE. No difficulty with gait.  Neurological: Alert and oriented. Coordination normal.    BMET    Component Value Date/Time   NA 141 12/03/2022 1016   K 4.5 12/03/2022 1016   CL 105 12/03/2022 1016   CO2 28 12/03/2022 1016   GLUCOSE 96 12/03/2022 1016   BUN 16 12/03/2022 1016   CREATININE 1.17 12/03/2022 1016   CALCIUM 9.6 12/03/2022 1016   GFRNONAA >60 08/20/2020 0516   GFRNONAA 95 10/05/2019 1036   GFRAA 111 10/05/2019 1036    Lipid Panel     Component Value Date/Time   CHOL 166 12/03/2022 1016   TRIG 63 12/03/2022 1016   HDL 59 12/03/2022 1016   CHOLHDL 2.8 12/03/2022 1016   LDLCALC 92 12/03/2022 1016    CBC    Component Value Date/Time   WBC 4.4 12/03/2022 1016   RBC 4.39 12/03/2022 1016   HGB 13.1 (L) 12/03/2022 1016   HCT 40.4 12/03/2022 1016   PLT 224 12/03/2022 1016   MCV 92.0 12/03/2022 1016   MCH 29.8 12/03/2022 1016   MCHC 32.4 12/03/2022 1016   RDW 12.1 12/03/2022 1016   LYMPHSABS 1.8 08/19/2020 0826   MONOABS 0.8 08/19/2020 0826   EOSABS 0.0 08/19/2020 0826    BASOSABS 0.0 08/19/2020 0826    Hgb A1C Lab Results  Component Value Date   HGBA1C 6.0 (H) 12/03/2022           Assessment & Plan:     RTC in 6 months for your annual exam Nicki Reaper, NP

## 2023-06-07 NOTE — Assessment & Plan Note (Signed)
CMET and lipid profile today Encouraged low fat diet Continue atorvasatin

## 2023-06-07 NOTE — Assessment & Plan Note (Signed)
A1C today Encouraged low car diet and exercise for weight loss

## 2023-06-07 NOTE — Assessment & Plan Note (Signed)
Continue gabapentin only as needed

## 2023-06-08 ENCOUNTER — Telehealth: Payer: Self-pay

## 2023-06-08 LAB — COMPLETE METABOLIC PANEL WITH GFR
AG Ratio: 2.1 (calc) (ref 1.0–2.5)
ALT: 17 U/L (ref 9–46)
AST: 16 U/L (ref 10–35)
Albumin: 4.4 g/dL (ref 3.6–5.1)
Alkaline phosphatase (APISO): 47 U/L (ref 35–144)
BUN: 20 mg/dL (ref 7–25)
CO2: 28 mmol/L (ref 20–32)
Calcium: 9.5 mg/dL (ref 8.6–10.3)
Chloride: 106 mmol/L (ref 98–110)
Creat: 1.2 mg/dL (ref 0.70–1.30)
Globulin: 2.1 g/dL (ref 1.9–3.7)
Glucose, Bld: 81 mg/dL (ref 65–139)
Potassium: 4.7 mmol/L (ref 3.5–5.3)
Sodium: 141 mmol/L (ref 135–146)
Total Bilirubin: 0.5 mg/dL (ref 0.2–1.2)
Total Protein: 6.5 g/dL (ref 6.1–8.1)
eGFR: 72 mL/min/{1.73_m2} (ref >=60)

## 2023-06-08 LAB — LIPID PANEL
Cholesterol: 175 mg/dL (ref <200)
HDL: 53 mg/dL (ref >=40)
LDL Cholesterol (Calc): 101 mg/dL — ABNORMAL HIGH
Non-HDL Cholesterol (Calc): 122 mg/dL (ref <130)
Total CHOL/HDL Ratio: 3.3 (calc) (ref <5.0)
Triglycerides: 110 mg/dL (ref <150)

## 2023-06-08 LAB — HEMOGLOBIN A1C
Hgb A1c MFr Bld: 5.9 %{Hb} — ABNORMAL HIGH (ref <5.7)
Mean Plasma Glucose: 123 mg/dL
eAG (mmol/L): 6.8 mmol/L

## 2023-06-08 NOTE — Telephone Encounter (Signed)
-----   Message from [facility] sent at 06/08/2023  9:41 AM EST ----- LDL cholesterol is marginally elevated but lipid panel otherwise looks great.  Continue cholesterol medication.  A1c is down to 5.9%, still within the prediabetic range.  Consume a low saturated fat, low carb diet.  Liver and kidney function is normal.

## 2023-06-08 NOTE — Telephone Encounter (Signed)
Attempted to reach patient regarding results.  No answer or voicemail.  Will try again later.

## 2023-06-08 NOTE — Telephone Encounter (Signed)
Pt called for lab results. Shared provider's note.  Pt would like a copy of the lab results sent to his home.   Lorre Munroe, NP 06/08/2023  9:41 AM EST     LDL cholesterol is marginally elevated but lipid panel otherwise looks great.  Continue cholesterol medication.  A1c is down to 5.9%, still within the prediabetic range.  Consume a low saturated fat, low carb diet.  Liver and kidney function is normal.

## 2023-06-09 NOTE — Telephone Encounter (Signed)
Results mailed to patient;.

## 2023-07-23 ENCOUNTER — Other Ambulatory Visit: Payer: Self-pay | Admitting: Internal Medicine

## 2023-07-23 NOTE — Telephone Encounter (Signed)
Requested Prescriptions  Pending Prescriptions Disp Refills   atorvastatin (LIPITOR) 10 MG tablet [Pharmacy Med Name: ATORVASTATIN 10 MG TABLET] 90 tablet 1    Sig: TAKE 1 TABLET BY MOUTH EVERY DAY     Cardiovascular:  Antilipid - Statins Failed - 07/23/2023 10:41 AM      Failed - Lipid Panel in normal range within the last 12 months    Cholesterol  Date Value Ref Range Status  06/07/2023 175 <200 mg/dL Final   LDL Cholesterol (Calc)  Date Value Ref Range Status  06/07/2023 101 (H) mg/dL (calc) Final    Comment:    Reference range: <100 . Desirable range <100 mg/dL for primary prevention;   <70 mg/dL for patients with CHD or diabetic patients  with > or = 2 CHD risk factors. Marland Kitchen LDL-C is now calculated using the Martin-Hopkins  calculation, which is a validated novel method providing  better accuracy than the Friedewald equation in the  estimation of LDL-C.  Horald Pollen et al. Lenox Ahr. 1478;295(62): 2061-2068  (http://education.[employer].com/faq/FAQ164)    HDL  Date Value Ref Range Status  06/07/2023 53 > OR = 40 mg/dL Final   Triglycerides  Date Value Ref Range Status  06/07/2023 110 <150 mg/dL Final         Passed - Patient is not pregnant      Passed - Valid encounter within last 12 months    Recent Outpatient Visits           1 month ago Prediabetes   [place] [facility] [place], Salvadore Oxford, NP   2 months ago Localized swelling of left lower extremity   [place] [facility] [place], Oswaldo Conroy, PA-C   7 months ago Encounter for general adult medical examination with abnormal findings   [place] [facility] [place], Salvadore Oxford, NP   11 months ago Tinea pedis of left foot   [place] [facility] [place], Salvadore Oxford, NP   1 year ago Prediabetes   [place] [facility] [place], Salvadore Oxford, NP       Future Appointments             In 4 months Baity, Salvadore Oxford, NP  [facility]

## 2023-08-28 ENCOUNTER — Ambulatory Visit
Admission: EM | Admit: 2023-08-28 | Discharge: 2023-08-28 | Disposition: A | Discharge status: Home / Self Care | Payer: No Typology Code available for payment source | Attending: Family Medicine | Admitting: Family Medicine

## 2023-08-28 DIAGNOSIS — J101 Influenza due to other identified influenza virus with other respiratory manifestations: Secondary | ICD-10-CM | POA: Diagnosis present

## 2023-08-28 LAB — RESP PANEL BY RT-PCR (FLU A&B, COVID) ARPGX2
Influenza A by PCR: POSITIVE — AB
Influenza B by PCR: NEGATIVE
SARS Coronavirus 2 by RT PCR: NEGATIVE

## 2023-08-28 MED ORDER — OSELTAMIVIR PHOSPHATE 75 MG PO CAPS: 75.0000 mg | ORAL_CAPSULE | Freq: Two times a day (BID) | ORAL | 0 refills | Status: DC

## 2023-08-28 MED ORDER — IBUPROFEN 600 MG PO TABS
600.0000 mg | ORAL_TABLET | Freq: Four times a day (QID) | ORAL | 0 refills | Status: DC | PRN
Start: 2023-08-28 — End: 2024-04-28

## 2023-08-28 MED ORDER — IBUPROFEN 800 MG PO TABS
800.0000 mg | ORAL_TABLET | Freq: Once | ORAL | Status: AC
  Administered 2023-08-28: 800 mg via ORAL

## 2023-08-28 NOTE — ED Triage Notes (Signed)
Pt c/o HA,diarrhea & bodyaches x3 days. Has tried tylenol w/o relief.

## 2023-08-28 NOTE — Discharge Instructions (Addendum)
You have influenza A.  Stop by the pharmacy to pick up your prescriptions.  Your symptoms will gradually improve over the next 7 to 10 days.  The cough may last longer than about 3 weeks.   It is important that you stay hydrated.  Take ibuprofen 600 mg with Tylenol 1000 mg for headache and bodyaches.

## 2023-08-28 NOTE — ED Provider Notes (Signed)
[facility]    CSN: [id number] Arrival date & time: 08/28/23  8295      History   Chief Complaint Chief Complaint  Patient presents with   Headache   Generalized Body Aches   Diarrhea    HPI Leonard Henderson is a 53 y.o. male.   HPI  History obtained from the patient. Leonard Henderson presents for headaches, body aches, decreased appetite, fatigue, diarrhea, abdominal discomfort, slight cough, scratchy throat that started on Thursday. Has had chills and felt warm but has not had a documented fever.  Denies rhinorrhea, nasal congestion, vomiting. Tried Tylenol and muscle relaxer. People have been sick at work.     Past Medical History:  Diagnosis Date   Medical history non-contributory     Patient Active Problem List   Diagnosis Date Noted   Sensory peripheral neuropathy 06/02/2022   Mixed hyperlipidemia 11/27/2021   Chronic shoulder pain 03/05/2021   Prediabetes 10/05/2019    Past Surgical History:  Procedure Laterality Date   COLONOSCOPY WITH PROPOFOL N/A 11/06/2019   Procedure: COLONOSCOPY WITH BIOPSY;  Surgeon: Midge Minium, MD;  Location: [facility];  Service: Endoscopy;  Laterality: N/A;  priority 4   POLYPECTOMY N/A 11/06/2019   Procedure: POLYPECTOMY;  Surgeon: Midge Minium, MD;  Location: [facility];  Service: Endoscopy;  Laterality: N/A;   TONSILLECTOMY     WRIST SURGERY Right        Home Medications    Prior to Admission medications   Medication Sig Start Date End Date Taking? Authorizing Provider  atorvastatin (LIPITOR) 10 MG tablet TAKE 1 TABLET BY MOUTH EVERY DAY 07/23/23  Yes Baity, Salvadore Oxford, NP  Cholecalciferol (VITAMIN D3 PO) Take by mouth.   Yes [provider]  Cod Liver Oil 1000 MG CAPS Take by mouth.   Yes [provider]  Cyanocobalamin (B-12 PO) Take by mouth.   Yes [provider]  gabapentin (NEURONTIN) 300 MG capsule Take 1 capsule (300 mg total) by mouth 2 (two) times daily. 06/07/23  Yes  Lorre Munroe, NP  ibuprofen (ADVIL) 600 MG tablet Take 1 tablet (600 mg total) by mouth every 6 (six) hours as needed. 08/28/23  Yes Gerritt Galentine, DO  methocarbamol (ROBAXIN) 750 MG tablet Take 1 tablet (750 mg total) by mouth at bedtime as needed for muscle spasms. 11/27/21  Yes Lorre Munroe, NP  oseltamivir (TAMIFLU) 75 MG capsule Take 1 capsule (75 mg total) by mouth every 12 (twelve) hours. 08/28/23  Yes Katha Cabal, DO    Family History Family History  Problem Relation Age of Onset   Hypertension Mother    Prostate cancer Father 79   Hypertension Brother    Breast cancer Sister 39    Social History Social History   Tobacco Use   Smoking status: Never   Smokeless tobacco: Never  Vaping Use   Vaping status: Never Used  Substance Use Topics   Alcohol use: Not Currently    Comment: on weekends   Drug use: Yes    Types: Marijuana    Comment: 2x per month over last 10 years     Allergies   Patient has no known allergies.   Review of Systems Review of Systems: negative unless otherwise stated in HPI.      Physical Exam Triage Vital Signs ED Triage Vitals [08/28/23 0822]  Encounter Vitals Group     BP      Systolic BP Percentile      Diastolic BP  Percentile      Pulse      Resp 16     Temp      Temp Source Oral     SpO2      Weight      Height      Head Circumference      Peak Flow      Pain Score      Pain Loc      Pain Education      Exclude from Growth Chart    No data found.  Updated Vital Signs BP (!) 123/96 (BP Location: Left Arm)   Pulse 60   Temp 98.6 F (37 C) (Oral)   Resp 16   Ht 5\' 11"  (1.803 m)   Wt 65.8 kg   SpO2 99%   BMI 20.22 kg/m   Visual Acuity Right Eye Distance:   Left Eye Distance:   Bilateral Distance:    Right Eye Near:   Left Eye Near:    Bilateral Near:     Physical Exam GEN:     alert, non-toxic appearing male in no distress    HENT:  mucus membranes moist, oropharyngeal without lesions, mild  erythema, no tonsils visible, no exudates, no nasal discharge, bilateral TM normal EYES:   no scleral injection or discharge NECK:  normal ROM, no meningismus   RESP:  no increased work of breathing, clear to auscultation bilaterally CVS:   regular rate and rhythm Skin:   warm and dry    UC Treatments / Results  Labs (all labs ordered are listed, but only abnormal results are displayed) Labs Reviewed  RESP PANEL BY RT-PCR (FLU A&B, COVID) ARPGX2 - Abnormal; Notable for the following components:      Result Value   Influenza A by PCR POSITIVE (*)    All other components within normal limits    EKG   Radiology No results found.  Procedures Procedures (including critical care time)  Medications Ordered in UC Medications  ibuprofen (ADVIL) tablet 800 mg (800 mg Oral Given 08/28/23 0850)    Initial Impression / Assessment and Plan / UC Course  I have reviewed the triage vital signs and the nursing notes.  Pertinent labs & imaging results that were available during my care of the patient were reviewed by me and considered in my medical decision making (see chart for details).       Pt is a 53 y.o. male who presents for 3 days of respiratory symptoms. Leonard Henderson is afebrile here without recent antipyretics. Given ibuprofen for headache.  Satting well on room air. Overall pt is non-toxic appearing, well hydrated, without respiratory distress. Pulmonary exam is unremarkable.  COVID and influenza panel obtained and he is influenza A positive. After shared decision making, he is interested in Tamiflu.  Prescription sent to the pharmacy.  Serum creatinine is stable. Discussed symptomatic treatment.  Explained lack of efficacy of antibiotics in viral disease.  Typical duration of symptoms discussed.  Prescribed 600 mg every 6 hours as needed for headache and bodyaches.  Work not provided.   Return and ED precautions given and voiced understanding. Discussed MDM, treatment plan and plan for  follow-up with patient who agrees with plan.     Final Clinical Impressions(s) / UC Diagnoses   Final diagnoses:  Influenza A     Discharge Instructions      You have influenza A.  Stop by the pharmacy to pick up your prescriptions.  Your symptoms will gradually improve  over the next 7 to 10 days.  The cough may last longer than about 3 weeks.   It is important that you stay hydrated.  Take ibuprofen 600 mg with Tylenol 1000 mg for headache and bodyaches.      ED Prescriptions     Medication Sig Dispense Auth. Provider   oseltamivir (TAMIFLU) 75 MG capsule Take 1 capsule (75 mg total) by mouth every 12 (twelve) hours. 10 capsule Stana Bayon, DO   ibuprofen (ADVIL) 600 MG tablet Take 1 tablet (600 mg total) by mouth every 6 (six) hours as needed. 30 tablet Katha Cabal, DO      PDMP not reviewed this encounter.   Katha Cabal, DO 08/28/23 [id number]

## 2023-08-30 ENCOUNTER — Ambulatory Visit: Payer: Self-pay

## 2023-08-30 NOTE — Telephone Encounter (Signed)
I am unable to give him a note without seeing him for this specific issue.

## 2023-08-30 NOTE — Telephone Encounter (Signed)
  Chief Complaint: Flu Symptoms: BA, HA, Sore throat, congestion Frequency: Thursday Pertinent Negatives: Patient denies  Disposition: [] ED /[] Urgent Care (no appt availability in office) / [] Appointment(In office/virtual)/ []  [place] [facility]/ [] Home Care/ [] Refused Recommended Disposition /[] [facility]/ [x]  Follow-up with PCP Additional Notes: Pt was seen at  [facility] and was given note to stay home until today. Pt states he is still not feeling well enough to go back to work. Pt would like a note to stay out of work a few more days. No appts in office. Please advise if provider is able to give pt note.   Summary: Influenza A Advice   Pt is calling to report dx 08/28/23 Influenza A. Pt is calling to report that he still has sx not feeling well. He return to work note end tomorrow. Pt would like to know where he can get an extension for work note. No available appts today. Please advise     Answer Assessment - Initial Assessment Questions 1. WORST SYMPTOM: "What is your worst symptom?" (e.g., cough, runny nose, muscle aches, headache, sore throat, fever)      HA, BA, Cough, sore throat, congestion 2. ONSET: "When did your flu symptoms start?"      Thursday 3. COUGH: "How bad is the cough?"       moderate 4. RESPIRATORY DISTRESS: "Describe your breathing."      cough 5. FEVER: "Do you have a fever?" If Yes, ask: "What is your temperature, how was it measured, and when did it start?"     no 7. FLU VACCINE: "Did you get a flu shot this year?"     no 10. OTHER SYMPTOMS: "Do you have any other symptoms?"  (e.g., runny nose, muscle aches, headache, sore throat)       BA, HA, cough, sore throat, Congestion  Protocols used: Influenza (Flu) - [facility]

## 2023-08-30 NOTE — Telephone Encounter (Signed)
Message from Alessandra Bevels sent at 08/30/2023  9:26 AM EST  Summary: Influenza A Advice   Pt is calling to report dx 08/28/23 Influenza A. Pt is calling to report that he still has sx not feeling well. He return to work note end tomorrow. Pt would like to know where he can get an extension for work note. No available appts today. Please advise        Called pt but VM full.

## 2023-08-30 NOTE — Telephone Encounter (Signed)
Spoke to patient. Explained we cannot give a work note with out seeing him. He stated he will go back to UC for a follow up and work note.

## 2023-08-31 ENCOUNTER — Encounter: Payer: Self-pay | Admitting: Internal Medicine

## 2023-08-31 ENCOUNTER — Ambulatory Visit (INDEPENDENT_AMBULATORY_CARE_PROVIDER_SITE_OTHER): Payer: 59 | Admitting: Internal Medicine

## 2023-08-31 VITALS — BP 112/72 | Temp 98.3°F | Ht 71.0 in | Wt 147.6 lb

## 2023-08-31 DIAGNOSIS — J101 Influenza due to other identified influenza virus with other respiratory manifestations: Secondary | ICD-10-CM

## 2023-08-31 MED ORDER — METHYLPREDNISOLONE ACETATE 80 MG/ML IJ SUSP
80.0000 mg | Freq: Once | INTRAMUSCULAR | Status: AC
  Administered 2023-08-31: 80 mg via INTRAMUSCULAR

## 2023-08-31 NOTE — Patient Instructions (Signed)
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
## 2023-09-22 ENCOUNTER — Ambulatory Visit: Payer: No Typology Code available for payment source | Admitting: Internal Medicine

## 2023-10-27 ENCOUNTER — Other Ambulatory Visit: Payer: Self-pay | Admitting: Internal Medicine

## 2023-10-28 NOTE — Telephone Encounter (Signed)
 Requested Prescriptions  Pending Prescriptions Disp Refills   atorvastatin (LIPITOR) 10 MG tablet [Pharmacy Med Name: ATORVASTATIN 10 MG TABLET] 90 tablet 1    Sig: TAKE 1 TABLET BY MOUTH EVERY DAY     Cardiovascular:  Antilipid - Statins Failed - 10/28/2023 12:29 PM      Failed - Valid encounter within last 12 months    Recent Outpatient Visits   None     Future Appointments             In 1 month Baity, Salvadore Oxford, NP [facility]            Failed - Lipid Panel in normal range within the last 12 months    Cholesterol  Date Value Ref Range Status  06/07/2023 175 <200 mg/dL Final   LDL Cholesterol (Calc)  Date Value Ref Range Status  06/07/2023 101 (H) mg/dL (calc) Final    Comment:    Reference range: <100 . Desirable range <100 mg/dL for primary prevention;   <70 mg/dL for patients with CHD or diabetic patients  with > or = 2 CHD risk factors. Marland Kitchen LDL-C is now calculated using the Martin-Hopkins  calculation, which is a validated novel method providing  better accuracy than the Friedewald equation in the  estimation of LDL-C.  Horald Pollen et al. Lenox Ahr. 1324;401(02): 2061-2068  (http://education.[facility].com/faq/FAQ164)    HDL  Date Value Ref Range Status  06/07/2023 53 > OR = 40 mg/dL Final   Triglycerides  Date Value Ref Range Status  06/07/2023 110 <150 mg/dL Final         Passed - Patient is not pregnant

## 2023-11-07 ENCOUNTER — Emergency Department

## 2023-11-07 ENCOUNTER — Emergency Department
Admission: EM | Admit: 2023-11-07 | Discharge: 2023-11-07 | Disposition: A | Discharge status: Home / Self Care | Attending: Emergency Medicine | Admitting: Emergency Medicine

## 2023-11-07 ENCOUNTER — Other Ambulatory Visit: Payer: Self-pay

## 2023-11-07 DIAGNOSIS — Y92410 Unspecified street and highway as the place of occurrence of the external cause: Secondary | ICD-10-CM | POA: Insufficient documentation

## 2023-11-07 DIAGNOSIS — F10929 Alcohol use, unspecified with intoxication, unspecified: Secondary | ICD-10-CM | POA: Diagnosis not present

## 2023-11-07 DIAGNOSIS — Z041 Encounter for examination and observation following transport accident: Secondary | ICD-10-CM | POA: Insufficient documentation

## 2023-11-07 LAB — CBC WITH DIFFERENTIAL/PLATELET
Abs Immature Granulocytes: 0.02 10*3/uL (ref 0.00–0.07)
Basophils Absolute: 0 10*3/uL (ref 0.0–0.1)
Basophils Relative: 1 %
Eosinophils Absolute: 0.1 10*3/uL (ref 0.0–0.5)
Eosinophils Relative: 1 %
HCT: 41.1 % (ref 39.0–52.0)
Hemoglobin: 13.2 g/dL (ref 13.0–17.0)
Immature Granulocytes: 0 %
Lymphocytes Relative: 48 %
Lymphs Abs: 2.8 10*3/uL (ref 0.7–4.0)
MCH: 30.3 pg (ref 26.0–34.0)
MCHC: 32.1 g/dL (ref 30.0–36.0)
MCV: 94.3 fL (ref 80.0–100.0)
Monocytes Absolute: 0.8 10*3/uL (ref 0.1–1.0)
Monocytes Relative: 14 %
Neutro Abs: 2.1 10*3/uL (ref 1.7–7.7)
Neutrophils Relative %: 36 %
Platelets: 230 10*3/uL (ref 150–400)
RBC: 4.36 MIL/uL (ref 4.22–5.81)
RDW: 12.8 % (ref 11.5–15.5)
WBC: 5.8 10*3/uL (ref 4.0–10.5)
nRBC: 0 % (ref 0.0–0.2)

## 2023-11-07 LAB — BASIC METABOLIC PANEL WITH GFR
Anion gap: 9 (ref 5–15)
BUN: 14 mg/dL (ref 6–20)
CO2: 22 mmol/L (ref 22–32)
Calcium: 8.6 mg/dL — ABNORMAL LOW (ref 8.9–10.3)
Chloride: 108 mmol/L (ref 98–111)
Creatinine, Ser: 1.01 mg/dL (ref 0.61–1.24)
GFR, Estimated: >60 mL/min (ref >60)
Glucose, Bld: 111 mg/dL — ABNORMAL HIGH (ref 70–99)
Potassium: 3.9 mmol/L (ref 3.5–5.1)
Sodium: 139 mmol/L (ref 135–145)

## 2023-11-07 LAB — ETHANOL: Alcohol, Ethyl (B): 231 mg/dL — ABNORMAL HIGH (ref <10)

## 2023-11-07 NOTE — ED Notes (Signed)
 Pt ambulatory with steady and even gait.
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
## 2023-12-06 ENCOUNTER — Encounter: Payer: Self-pay | Admitting: Internal Medicine

## 2024-02-24 NOTE — Progress Notes (Unsigned)
 Subjective:    Patient ID: Leonard Henderson, male    DOB: Feb 02, 1971, 53 y.o.   MRN: [id number]  HPI  Patient presents to clinic today for his annual exam.  Flu: never Tetanus: 08/2020 COVID: x 2 Shingrix : 11/2021, 01/2022 PSA screening: 12/2022 Colon screening: 11/2019 Vision screening: as needed Dentist: biannually  Diet: He does eat meat. He consumes fruits and veggies. He does eat some fried foods. He drinks mostly juice, water  Exercise: Swimming  Review of Systems  Past Medical History:  Diagnosis Date  . Medical history non-contributory     Current Outpatient Medications  Medication Sig Dispense Refill  . atorvastatin  (LIPITOR) 10 MG tablet TAKE 1 TABLET BY MOUTH EVERY DAY 90 tablet 1  . Cholecalciferol (VITAMIN D3 PO) Take by mouth.    SABRA Cod Liver Oil 1000 MG CAPS Take by mouth.    . Cyanocobalamin (B-12 PO) Take by mouth.    . gabapentin  (NEURONTIN ) 300 MG capsule Take 1 capsule (300 mg total) by mouth 2 (two) times daily. 180 capsule 1  . ibuprofen  (ADVIL ) 600 MG tablet Take 1 tablet (600 mg total) by mouth every 6 (six) hours as needed. 30 tablet 0  . methocarbamol  (ROBAXIN ) 750 MG tablet Take 1 tablet (750 mg total) by mouth at bedtime as needed for muscle spasms. 90 tablet 1  . oseltamivir  (TAMIFLU ) 75 MG capsule Take 1 capsule (75 mg total) by mouth every 12 (twelve) hours. 10 capsule 0   No current facility-administered medications for this visit.    No Known Allergies  Family History  Problem Relation Age of Onset  . Hypertension Mother   . Prostate cancer Father 61  . Hypertension Brother   . Breast cancer Sister 4    Social History   Socioeconomic History  . Marital status: Married    Spouse name: Not on file  . Number of children: 3  . Years of education: Not on file  . Highest education level: High school graduate  Occupational History  . Occupation: cabinets    Comment: temp agency  Tobacco Use  . Smoking status: Never  . Smokeless  tobacco: Never  Vaping Use  . Vaping status: Never Used  Substance and Sexual Activity  . Alcohol use: Not Currently    Comment: on weekends  . Drug use: Yes    Types: Marijuana    Comment: 2x per month over last 10 years  . Sexual activity: Yes    Birth control/protection: None  Other Topics Concern  . Not on file  Social History Narrative   ** Merged History Encounter **       Social Drivers of Health   Financial Resource Strain: High Risk (09/22/2023)   Received from [facility]   Overall Financial Resource Strain (CARDIA)   . Difficulty of Paying Living Expenses: Hard  Food Insecurity: Food Insecurity Present (09/22/2023)   Received from [facility]   Hunger Vital Sign   . Within the past 12 months, you worried that your food would run out before you got the money to buy more.: Sometimes true   . Within the past 12 months, the food you bought just didn't last and you didn't have money to get more.: Sometimes true  Transportation Needs: No Transportation Needs (09/22/2023)   Received from [facility]   [facility] - Transportation   . In the past 12 months, has lack of transportation kept you from medical appointments or from  getting medications?: No   . Lack of Transportation (Non-Medical): No  Physical Activity: Not on file  Stress: Not on file  Social Connections: Not on file  Intimate Partner Violence: Not on file     Constitutional: Denies fever, malaise, fatigue, headache or abrupt weight changes.  HEENT: Denies eye pain, eye redness, ear pain, ringing in the ears, wax buildup, runny nose, nasal congestion, bloody nose, or sore throat. Respiratory: Denies difficulty breathing, shortness of breath, cough or sputum production.   Cardiovascular: Denies chest pain, chest tightness, palpitations or swelling in the hands or feet.  Gastrointestinal: Denies abdominal pain, bloating, constipation, diarrhea or blood in the  stool.  GU: Denies urgency, frequency, pain with urination, burning sensation, blood in urine, odor or discharge. Musculoskeletal: Denies decrease in range of motion, difficulty with gait, muscle pain or joint pain or swelling.  Skin: Denies redness, rashes, lesions or ulcercations.  Neurological: Patient reports neuropathic pain.  Denies dizziness, difficulty with memory, difficulty with speech or problems with balance and coordination.  Psych: Denies anxiety, depression, SI/HI.  No other specific complaints in a complete review of systems (except as listed in HPI above).     Objective:   Physical Exam   BP 120/82 (BP Location: Left Arm, Patient Position: Sitting, Cuff Size: Normal)   Pulse 60   Ht 6' 2 (1.88 m)   Wt 145 lb 5 oz (65.9 kg)   SpO2 99%   BMI 18.66 kg/m    Wt Readings from Last 3 Encounters:  08/31/23 147 lb 9.6 oz (67 kg)  08/28/23 145 lb (65.8 kg)  06/07/23 150 lb 12.8 oz (68.4 kg)    General: Appears his stated age, well developed, well nourished in NAD. Skin: Warm, dry and intact.  HEENT: Head: normal shape and size; Eyes: sclera white, no icterus, conjunctiva pink, PERRLA and EOMs intact;  Neck:  Neck supple, trachea midline. No masses, lumps or thyromegaly present.  Cardiovascular: Normal rate and rhythm. S1,S2 noted.  No murmur, rubs or gallops noted. No JVD or BLE edema. No carotid bruits noted. Pulmonary/Chest: Normal effort and positive vesicular breath sounds. No respiratory distress. No wheezes, rales or ronchi noted.  Abdomen: Normal bowel sounds. Musculoskeletal: Strength 5/5 BUE/BLE.  No difficulty with gait.  Neurological: Alert and oriented. Cranial nerves II-XII grossly intact. Coordination normal.  Psychiatric: Mood and affect normal. Behavior is normal. Judgment and thought content normal.    BMET    Component Value Date/Time   NA 139 11/07/2023 1945   K 3.9 11/07/2023 1945   CL 108 11/07/2023 1945   CO2 22 11/07/2023 1945   GLUCOSE  111 (H) 11/07/2023 1945   BUN 14 11/07/2023 1945   CREATININE 1.01 11/07/2023 1945   CREATININE 1.20 06/07/2023 1104   CALCIUM  8.6 (L) 11/07/2023 1945   GFRNONAA >60 11/07/2023 1945   GFRNONAA 95 10/05/2019 1036   GFRAA 111 10/05/2019 1036    Lipid Panel     Component Value Date/Time   CHOL 175 06/07/2023 1104   TRIG 110 06/07/2023 1104   HDL 53 06/07/2023 1104   CHOLHDL 3.3 06/07/2023 1104   LDLCALC 101 (H) 06/07/2023 1104    CBC    Component Value Date/Time   WBC 5.8 11/07/2023 1945   RBC 4.36 11/07/2023 1945   HGB 13.2 11/07/2023 1945   HCT 41.1 11/07/2023 1945   PLT 230 11/07/2023 1945   MCV 94.3 11/07/2023 1945   MCH 30.3 11/07/2023 1945   MCHC 32.1 11/07/2023 1945  RDW 12.8 11/07/2023 1945   LYMPHSABS 2.8 11/07/2023 1945   MONOABS 0.8 11/07/2023 1945   EOSABS 0.1 11/07/2023 1945   BASOSABS 0.0 11/07/2023 1945    Hgb A1C Lab Results  Component Value Date   HGBA1C 5.9 (H) 06/07/2023           Assessment & Plan:   Preventative Health Maintenance:  Encouraged him to get a flu shot in the fall Tetanus UTD Encouraged him to get his covid booster Shingrix  UTD Colon screening UTD Encouraged him to consume a balanced diet and exercise regimen Advised him to see an eye doctor and dentist annually Will check CBC, CMET, Lipid, A1C and PSA today  RTC in 6 months, follow up chronic conditions Angeline Laura, NP

## 2024-02-25 ENCOUNTER — Ambulatory Visit (INDEPENDENT_AMBULATORY_CARE_PROVIDER_SITE_OTHER): Admitting: Internal Medicine

## 2024-02-25 ENCOUNTER — Encounter: Payer: Self-pay | Admitting: Internal Medicine

## 2024-02-25 VITALS — BP 120/82 | HR 60 | Ht 74.0 in | Wt 145.3 lb

## 2024-02-25 DIAGNOSIS — Z125 Encounter for screening for malignant neoplasm of prostate: Secondary | ICD-10-CM | POA: Diagnosis not present

## 2024-02-25 DIAGNOSIS — R7303 Prediabetes: Secondary | ICD-10-CM | POA: Diagnosis not present

## 2024-02-25 DIAGNOSIS — E782 Mixed hyperlipidemia: Secondary | ICD-10-CM | POA: Diagnosis not present

## 2024-02-25 DIAGNOSIS — Z0000 Encounter for general adult medical examination without abnormal findings: Secondary | ICD-10-CM

## 2024-02-25 NOTE — Patient Instructions (Signed)
 Health Maintenance, Male  Adopting a healthy lifestyle and getting preventive care are important in promoting health and wellness. Ask your health care provider about:  The right schedule for you to have regular tests and exams.  Things you can do on your own to prevent diseases and keep yourself healthy.  What should I know about diet, weight, and exercise?  Eat a healthy diet    Eat a diet that includes plenty of vegetables, fruits, low-fat dairy products, and lean protein.  Do not eat a lot of foods that are high in solid fats, added sugars, or sodium.  Maintain a healthy weight  Body mass index (BMI) is a measurement that can be used to identify possible weight problems. It estimates body fat based on height and weight. Your health care provider can help determine your BMI and help you achieve or maintain a healthy weight.  Get regular exercise  Get regular exercise. This is one of the most important things you can do for your health. Most adults should:  Exercise for at least 150 minutes each week. The exercise should increase your heart rate and make you sweat (moderate-intensity exercise).  Do strengthening exercises at least twice a week. This is in addition to the moderate-intensity exercise.  Spend less time sitting. Even light physical activity can be beneficial.  Watch cholesterol and blood lipids  Have your blood tested for lipids and cholesterol at 55 years of age, then have this test every 5 years.  You may need to have your cholesterol levels checked more often if:  Your lipid or cholesterol levels are high.  You are older than 55 years of age.  You are at high risk for heart disease.  What should I know about cancer screening?  Many types of cancers can be detected early and may often be prevented. Depending on your health history and family history, you may need to have cancer screening at various ages. This may include screening for:  Colorectal cancer.  Prostate cancer.  Skin cancer.  Lung  cancer.  What should I know about heart disease, diabetes, and high blood pressure?  Blood pressure and heart disease  High blood pressure causes heart disease and increases the risk of stroke. This is more likely to develop in people who have high blood pressure readings or are overweight.  Talk with your health care provider about your target blood pressure readings.  Have your blood pressure checked:  Every 3-5 years if you are 9-95 years of age.  Every year if you are 85 years old or older.  If you are between the ages of 29 and 29 and are a current or former smoker, ask your health care provider if you should have a one-time screening for abdominal aortic aneurysm (AAA).  Diabetes  Have regular diabetes screenings. This checks your fasting blood sugar level. Have the screening done:  Once every three years after age 23 if you are at a normal weight and have a low risk for diabetes.  More often and at a younger age if you are overweight or have a high risk for diabetes.  What should I know about preventing infection?  Hepatitis B  If you have a higher risk for hepatitis B, you should be screened for this virus. Talk with your health care provider to find out if you are at risk for hepatitis B infection.  Hepatitis C  Blood testing is recommended for:  Everyone born from [birth year] through 1967.  Anyone  with known risk factors for hepatitis C.  Sexually transmitted infections (STIs)  You should be screened each year for STIs, including gonorrhea and chlamydia, if:  You are sexually active and are younger than 55 years of age.  You are older than 55 years of age and your health care provider tells you that you are at risk for this type of infection.  Your sexual activity has changed since you were last screened, and you are at increased risk for chlamydia or gonorrhea. Ask your health care provider if you are at risk.  Ask your health care provider about whether you are at high risk for HIV. Your health care provider  may recommend a prescription medicine to help prevent HIV infection. If you choose to take medicine to prevent HIV, you should first get tested for HIV. You should then be tested every 3 months for as long as you are taking the medicine.  Follow these instructions at home:  Alcohol use  Do not drink alcohol if your health care provider tells you not to drink.  If you drink alcohol:  Limit how much you have to 0-2 drinks a day.  Know how much alcohol is in your drink. In the [place] one drink equals one 12 oz bottle of beer (355 mL), one 5 oz glass of wine (148 mL), or one 1 oz glass of hard liquor (44 mL).  Lifestyle  Do not use any products that contain nicotine or tobacco. These products include cigarettes, chewing tobacco, and vaping devices, such as e-cigarettes. If you need help quitting, ask your health care provider.  Do not use street drugs.  Do not share needles.  Ask your health care provider for help if you need support or information about quitting drugs.  General instructions  Schedule regular health, dental, and eye exams.  Stay current with your vaccines.  Tell your health care provider if:  You often feel depressed.  You have ever been abused or do not feel safe at home.  Summary  Adopting a healthy lifestyle and getting preventive care are important in promoting health and wellness.  Follow your health care provider's instructions about healthy diet, exercising, and getting tested or screened for diseases.  Follow your health care provider's instructions on monitoring your cholesterol and blood pressure.  This information is not intended to replace advice given to you by your health care provider. Make sure you discuss any questions you have with your health care provider.  Document Revised: 12/09/2020 Document Reviewed: 12/09/2020  Elsevier Patient Education  2024 [employer].

## 2024-02-26 LAB — LIPID PANEL
Cholesterol: 146 mg/dL (ref <200)
HDL: 51 mg/dL (ref >=40)
LDL Cholesterol (Calc): 81 mg/dL
Non-HDL Cholesterol (Calc): 95 mg/dL (ref <130)
Total CHOL/HDL Ratio: 2.9 (calc) (ref <5.0)
Triglycerides: 64 mg/dL (ref <150)

## 2024-02-26 LAB — COMPREHENSIVE METABOLIC PANEL WITH GFR
AG Ratio: 2 (calc) (ref 1.0–2.5)
ALT: 20 U/L (ref 9–46)
AST: 19 U/L (ref 10–35)
Albumin: 4.2 g/dL (ref 3.6–5.1)
Alkaline phosphatase (APISO): 61 U/L (ref 35–144)
BUN: 15 mg/dL (ref 7–25)
CO2: 28 mmol/L (ref 20–32)
Calcium: 9.5 mg/dL (ref 8.6–10.3)
Chloride: 106 mmol/L (ref 98–110)
Creat: 0.89 mg/dL (ref 0.70–1.30)
Globulin: 2.1 g/dL (ref 1.9–3.7)
Glucose, Bld: 106 mg/dL — ABNORMAL HIGH (ref 65–99)
Potassium: 4.7 mmol/L (ref 3.5–5.3)
Sodium: 140 mmol/L (ref 135–146)
Total Bilirubin: 0.3 mg/dL (ref 0.2–1.2)
Total Protein: 6.3 g/dL (ref 6.1–8.1)
eGFR: 101 mL/min/1.73m2 (ref >=60)

## 2024-02-26 LAB — CBC
HCT: 40.7 % (ref 38.5–50.0)
Hemoglobin: 13 g/dL — ABNORMAL LOW (ref 13.2–17.1)
MCH: 29.3 pg (ref 27.0–33.0)
MCHC: 31.9 g/dL — ABNORMAL LOW (ref 32.0–36.0)
MCV: 91.7 fL (ref 80.0–100.0)
MPV: 11 fL (ref 7.5–12.5)
Platelets: 225 Thousand/uL (ref 140–400)
RBC: 4.44 Million/uL (ref 4.20–5.80)
RDW: 11.9 % (ref 11.0–15.0)
WBC: 4.5 Thousand/uL (ref 3.8–10.8)

## 2024-02-26 LAB — HEMOGLOBIN A1C
Hgb A1c MFr Bld: 6 % — ABNORMAL HIGH (ref <5.7)
Mean Plasma Glucose: 126 mg/dL
eAG (mmol/L): 7 mmol/L

## 2024-02-26 LAB — PSA: PSA: 1.82 ng/mL (ref <=4.00)

## 2024-02-28 ENCOUNTER — Ambulatory Visit: Payer: Self-pay | Admitting: Internal Medicine

## 2024-04-05 ENCOUNTER — Other Ambulatory Visit: Payer: Self-pay | Admitting: Internal Medicine

## 2024-04-06 NOTE — Telephone Encounter (Signed)
 Lipid panel in date.  Requested Prescriptions  Pending Prescriptions Disp Refills   atorvastatin  (LIPITOR) 10 MG tablet [Pharmacy Med Name: ATORVASTATIN  10 MG TABLET] 90 tablet 1    Sig: TAKE 1 TABLET BY MOUTH EVERY DAY     Cardiovascular:  Antilipid - Statins Failed - 04/06/2024 10:21 AM      Failed - Lipid Panel in normal range within the last 12 months    Cholesterol  Date Value Ref Range Status  02/25/2024 146 <200 mg/dL Final   LDL Cholesterol (Calc)  Date Value Ref Range Status  02/25/2024 81 mg/dL (calc) Final    Comment:    Reference range: <100 . Desirable range <100 mg/dL for primary prevention;   <70 mg/dL for patients with CHD or diabetic patients  with > or = 2 CHD risk factors. SABRA LDL-C is now calculated using the Martin-Hopkins  calculation, which is a validated novel method providing  better accuracy than the Friedewald equation in the  estimation of LDL-C.  Gladis APPLETHWAITE et al. SANDREA. 7986;689(80): 2061-2068  (http://education.[employer].com/faq/FAQ164)    HDL  Date Value Ref Range Status  02/25/2024 51 > OR = 40 mg/dL Final   Triglycerides  Date Value Ref Range Status  02/25/2024 64 <150 mg/dL Final         Passed - Patient is not pregnant      Passed - Valid encounter within last 12 months    Recent Outpatient Visits           1 month ago Encounter for general adult medical examination w/o abnormal findings   [facility] [place], Angeline ORN, [place]

## 2024-04-28 ENCOUNTER — Encounter: Payer: Self-pay | Admitting: Emergency Medicine

## 2024-04-28 ENCOUNTER — Telehealth: Payer: Self-pay | Admitting: Emergency Medicine

## 2024-04-28 ENCOUNTER — Ambulatory Visit (INDEPENDENT_AMBULATORY_CARE_PROVIDER_SITE_OTHER)

## 2024-04-28 ENCOUNTER — Ambulatory Visit
Admission: EM | Admit: 2024-04-28 | Discharge: 2024-04-28 | Disposition: A | Discharge status: Home / Self Care | Attending: Emergency Medicine | Admitting: Emergency Medicine

## 2024-04-28 DIAGNOSIS — S46912A Strain of unspecified muscle, fascia and tendon at shoulder and upper arm level, left arm, initial encounter: Secondary | ICD-10-CM

## 2024-04-28 DIAGNOSIS — M898X1 Other specified disorders of bone, shoulder: Secondary | ICD-10-CM

## 2024-04-28 DIAGNOSIS — M25512 Pain in left shoulder: Secondary | ICD-10-CM | POA: Insufficient documentation

## 2024-04-28 DIAGNOSIS — Z0001 Encounter for general adult medical examination with abnormal findings: Secondary | ICD-10-CM | POA: Diagnosis not present

## 2024-04-28 DIAGNOSIS — S4992XA Unspecified injury of left shoulder and upper arm, initial encounter: Secondary | ICD-10-CM | POA: Diagnosis not present

## 2024-04-28 MED ORDER — DICLOFENAC SODIUM 1 % EX GEL: 2.0000 g | Freq: Three times a day (TID) | CUTANEOUS | 0 refills | Status: DC | PRN

## 2024-04-28 MED ORDER — DICLOFENAC SODIUM 1 % EX GEL: 2.0000 g | Freq: Three times a day (TID) | CUTANEOUS | 0 refills | Status: AC | PRN

## 2024-04-28 MED ORDER — CYCLOBENZAPRINE HCL 5 MG PO TABS: 5.0000 mg | ORAL_TABLET | Freq: Three times a day (TID) | ORAL | 0 refills | Status: DC | PRN

## 2024-04-28 MED ORDER — CYCLOBENZAPRINE HCL 5 MG PO TABS: 5.0000 mg | ORAL_TABLET | Freq: Three times a day (TID) | ORAL | 0 refills | Status: AC | PRN

## 2024-04-28 NOTE — ED Provider Notes (Signed)
 [facility]    CSN: [id number] Arrival date & time: 04/28/24  9048      History   Chief Complaint Chief Complaint  Patient presents with   Shoulder Pain    left    HPI Leonard Henderson is a 53 y.o. male.   53 year old male pt, Leonard Henderson, presents to urgent care for evaluation of left shoulder pain that started a week ago after picking up a heavy bag of cement.  Patient states he felt pain immediately when it happened, reports swelling in his left shoulder this morning. No meds tried.   Patient has a history of chronic shoulder pain had MRI 10/2021: MRI of the left shoulder shows tendinitis and a tendon tear in addition to arthritic changes of the [facility] joint and subchondral edema.  Pt was referred to Orthopedics for follow up at that time.   The history is provided by the patient. No language interpreter was used.    Past Medical History:  Diagnosis Date   Medical history non-contributory     Patient Active Problem List   Diagnosis Date Noted   Strain of left shoulder 04/28/2024   Pain of left clavicle 04/28/2024   Sensory peripheral neuropathy 06/02/2022   Mixed hyperlipidemia 11/27/2021   Acute pain of left shoulder 03/05/2021   Prediabetes 10/05/2019    Past Surgical History:  Procedure Laterality Date   COLONOSCOPY WITH PROPOFOL  N/A 11/06/2019   Procedure: COLONOSCOPY WITH BIOPSY;  Surgeon: Jinny Carmine, MD;  Location: [facility];  Service: Endoscopy;  Laterality: N/A;  priority 4   POLYPECTOMY N/A 11/06/2019   Procedure: POLYPECTOMY;  Surgeon: Jinny Carmine, MD;  Location: [facility];  Service: Endoscopy;  Laterality: N/A;   TONSILLECTOMY     WRIST SURGERY Right        Home Medications    Prior to Admission medications   Medication Sig Start Date End Date Taking? Authorizing Provider  atorvastatin  (LIPITOR) 10 MG tablet TAKE 1 TABLET BY MOUTH EVERY DAY 04/06/24  Yes Baity, Angeline ORN, NP  Cholecalciferol (VITAMIN D3 PO) Take by  mouth.   Yes [provider]  Cyanocobalamin (B-12 PO) Take by mouth.   Yes [provider]  gabapentin  (NEURONTIN ) 300 MG capsule Take 1 capsule (300 mg total) by mouth 2 (two) times daily. 06/07/23  Yes Antonette Angeline ORN, NP  Cod Liver Oil 1000 MG CAPS Take by mouth.    [provider]  cyclobenzaprine  (FLEXERIL ) 5 MG tablet Take 1 tablet (5 mg total) by mouth 3 (three) times daily as needed for up to 5 days for muscle spasms. 04/28/24 05/03/24  Maleea Camilo, NP  diclofenac  Sodium (VOLTAREN ) 1 % GEL Apply 2 g topically every 8 (eight) hours as needed. 04/28/24   Ghada Abbett, Rilla, NP    Family History Family History  Problem Relation Age of Onset   Hypertension Mother    Prostate cancer Father 77   Hypertension Brother    Breast cancer Sister 85    Social History Social History   Tobacco Use   Smoking status: Never   Smokeless tobacco: Never  Vaping Use   Vaping status: Never Used  Substance Use Topics   Alcohol use: Not Currently    Comment: on weekends   Drug use: Yes    Types: Marijuana    Comment: 2x per month over last 10 years     Allergies   Patient has no known allergies.   Review of Systems Review  of Systems  Musculoskeletal:  Positive for arthralgias, joint swelling and myalgias.  Skin: Negative.   All other systems reviewed and are negative.    Physical Exam Triage Vital Signs ED Triage Vitals [04/28/24 1006]  Encounter Vitals Group     BP      Girls Systolic BP Percentile      Girls Diastolic BP Percentile      Boys Systolic BP Percentile      Boys Diastolic BP Percentile      Pulse      Resp      Temp      Temp src      SpO2      Weight 145 lb 4.5 oz (65.9 kg)     Height 6' 2 (1.88 m)     Head Circumference      Peak Flow      Pain Score 8     Pain Loc      Pain Education      Exclude from Growth Chart    No data found.  Updated Vital Signs BP 130/86 (BP Location: Right Arm)   Pulse (!) 52   Temp 98  F (36.7 C) (Oral)   Resp 15   Ht 6' 2 (1.88 m)   Wt 145 lb 4.5 oz (65.9 kg)   SpO2 98%   BMI 18.65 kg/m   Visual Acuity Right Eye Distance:   Left Eye Distance:   Bilateral Distance:    Right Eye Near:   Left Eye Near:    Bilateral Near:     Physical Exam Vitals and nursing note reviewed.  Constitutional:      Appearance: He is well-developed and well-groomed.  Cardiovascular:     Rate and Rhythm: Normal rate.     Pulses: Normal pulses.          Radial pulses are 2+ on the right side and 2+ on the left side.  Musculoskeletal:     Right shoulder: Swelling and bony tenderness present.       Arms:  Neurological:     General: No focal deficit present.     Mental Status: He is alert and oriented to person, place, and time.     GCS: GCS eye subscore is 4. GCS verbal subscore is 5. GCS motor subscore is 6.     Comments: Pt has full ROM of left shoulder, handgrips equal bilaterally  Psychiatric:        Behavior: Behavior is cooperative.      UC Treatments / Results  Labs (all labs ordered are listed, but only abnormal results are displayed) Labs Reviewed - No data to display  EKG   Radiology DG Clavicle Left Result Date: 04/28/2024 CLINICAL DATA:  Left shoulder pain 1 week after lifting injury. EXAM: LEFT CLAVICLE - 2+ VIEWS COMPARISON:  01/16/2021 FINDINGS: There is no evidence of fracture or other focal bone lesions. Soft tissues are unremarkable. IMPRESSION: Negative. Electronically Signed   By: Toribio Agreste M.D.   On: 04/28/2024 11:38   DG Shoulder Left Result Date: 04/28/2024 CLINICAL DATA:  Left shoulder pain 1 week after lifting injury. EXAM: LEFT SHOULDER - 2+ VIEW COMPARISON:  01/16/2021 FINDINGS: There is no evidence of fracture or dislocation. There is no evidence of arthropathy or other focal bone abnormality. Soft tissues are unremarkable. IMPRESSION: Negative. Electronically Signed   By: Toribio Agreste M.D.   On: 04/28/2024 11:37     Procedures Procedures (including critical care time)  Medications  Ordered in UC Medications - No data to display  Initial Impression / Assessment and Plan / UC Course  I have reviewed the triage vital signs and the nursing notes.  Pertinent labs & imaging results that were available during my care of the patient were reviewed by me and considered in my medical decision making (see chart for details).  Clinical Course as of 04/28/24 1654  Fri Apr 28, 2024  1127 Wet read,no fracture or dislocation noted, will refer to Orthopedics for follow up [JD]    Clinical Course User Index [JD] Cordae Mccarey, Rilla, NP   Discussed exam findings and plan of care with patient, negative wet read of xrays, scripted flexeril  and voltaren , referral to Ortho, work note given, strict go to ER precautions given.   Patient verbalized understanding to this provider.  Ddx: Left shoulder strain, acute pain on chronic shoulder pain, left clavicle pain, arthritis Final Clinical Impressions(s) / UC Diagnoses   Final diagnoses:  Acute pain of left shoulder  Pain of left clavicle  Strain of left shoulder, initial encounter     Discharge Instructions      Wet read of xray does not show any fracture or dislocation. Check my chart for officail radiology read. Most likely you have strained your left shoulder,clavicle region. Rest,ice, take flexeril  and voltaren  as prescribed. Follow up with your Orthopedist at [facility] or [facility]-call for appt. Avoid lifting as it will aggravate your symptoms.       ED Prescriptions     Medication Sig Dispense Auth. Provider   cyclobenzaprine  (FLEXERIL ) 5 MG tablet Take 1 tablet (5 mg total) by mouth 3 (three) times daily as needed for up to 5 days for muscle spasms. 15 tablet Ajaya Crutchfield, NP   diclofenac  Sodium (VOLTAREN ) 1 % GEL Apply 2 g topically every 8 (eight) hours as needed. 20 g Sohan Potvin, Rilla, NP      PDMP not reviewed this  encounter.   Aminta Rilla, NP 04/28/24 1654

## 2024-04-28 NOTE — Telephone Encounter (Signed)
 Pt called stating the pharmacy did not have prescriptions. Resent rxs as prescribed this morning.

## 2024-04-28 NOTE — Discharge Instructions (Addendum)
 Wet read of xray does not show any fracture or dislocation. Check my chart for officail radiology read. Most likely you have strained your left shoulder,clavicle region. Rest,ice, take flexeril  and voltaren  as prescribed. Follow up with your Orthopedist at [facility] or [facility]-call for appt. Avoid lifting as it will aggravate your symptoms.

## 2024-04-28 NOTE — ED Triage Notes (Signed)
 Patient c/o left shoulder pain that started a week ago after picking up a heavy bag of cement.  Patient states that he felt pain imediatley when that happened.  Patient reports swelling in his left shoulder this morning.

## 2024-06-12 ENCOUNTER — Telehealth: Payer: Self-pay

## 2024-06-12 NOTE — Telephone Encounter (Signed)
 Pt states he received a letter stating it time to scheduled procedure.

## 2024-06-12 NOTE — Telephone Encounter (Signed)
 Reminder created for me to call him.  [place], CMA

## 2024-06-12 NOTE — Telephone Encounter (Signed)
 Returned phone call.  LVM letting patient know that I will call him back in February to schedule his colonoscopy for April.  Thanks,  [place], CMA

## 2024-07-21 ENCOUNTER — Other Ambulatory Visit: Payer: Self-pay | Admitting: Internal Medicine

## 2024-07-25 NOTE — Telephone Encounter (Signed)
 Requested Prescriptions  Pending Prescriptions Disp Refills   gabapentin  (NEURONTIN ) 300 MG capsule [Pharmacy Med Name: GABAPENTIN  300 MG CAPSULE] 60 capsule 5    Sig: TAKE 1 CAPSULE BY MOUTH TWICE A DAY     Neurology: Anticonvulsants - gabapentin  Passed - 07/25/2024  1:30 PM      Passed - Cr in normal range and within 360 days    Creat  Date Value Ref Range Status  02/25/2024 0.89 0.70 - 1.30 mg/dL Final         Passed - Completed PHQ-2 or PHQ-9 in the last 360 days      Passed - Valid encounter within last 12 months    Recent Outpatient Visits           5 months ago Encounter for general adult medical examination w/o abnormal findings   [place] [place], Angeline ORN, [place]

## 2024-08-25 ENCOUNTER — Ambulatory Visit: Admitting: Internal Medicine

## 2024-08-25 ENCOUNTER — Encounter: Payer: Self-pay | Admitting: Internal Medicine

## 2024-08-25 VITALS — BP 114/70 | Ht 74.0 in | Wt 148.0 lb

## 2024-08-25 DIAGNOSIS — Z23 Encounter for immunization: Secondary | ICD-10-CM | POA: Diagnosis not present

## 2024-08-25 DIAGNOSIS — R7303 Prediabetes: Secondary | ICD-10-CM | POA: Diagnosis not present

## 2024-08-25 DIAGNOSIS — G8929 Other chronic pain: Secondary | ICD-10-CM | POA: Diagnosis not present

## 2024-08-25 DIAGNOSIS — G608 Other hereditary and idiopathic neuropathies: Secondary | ICD-10-CM

## 2024-08-25 DIAGNOSIS — M25512 Pain in left shoulder: Secondary | ICD-10-CM

## 2024-08-25 DIAGNOSIS — M25511 Pain in right shoulder: Secondary | ICD-10-CM

## 2024-08-25 DIAGNOSIS — E782 Mixed hyperlipidemia: Secondary | ICD-10-CM | POA: Diagnosis not present

## 2024-08-25 NOTE — Assessment & Plan Note (Signed)
A1C today Encouraged low carb diet

## 2024-08-25 NOTE — Assessment & Plan Note (Signed)
 Continue gabapentin  300 mg BID  Will monitor

## 2024-08-25 NOTE — Patient Instructions (Signed)

## 2024-08-25 NOTE — Assessment & Plan Note (Signed)
 Continue gabapentin  300 mg BID and voltaren  gel OTC as needed

## 2024-08-25 NOTE — Assessment & Plan Note (Signed)
 CMET and lipid profile today Encouraged him to consume a low fat diet Continue atorvastatin  10 mg daily

## 2024-08-25 NOTE — Progress Notes (Signed)
 "  Subjective:    Patient ID: Leonard Henderson, male    DOB: 1970/12/21, 54 y.o.   MRN: [id number]  HPI  Pt presents to the clinic today for 6 month followup of chronic conditions.  HLD: His last LDL was 81, triglycerides 64, 02/2024. He denies myalgias on atorvastatin . He does not consume a low fat diet.  Prediabetes: His last A1C was 6%, 02/2024. He is not taking any oral diabetic medication at this time. He does not check his sugars.  Chronic shoulder pain:  MRI of shoulders from 10/2021 reviewed. Managed with gabapentin  and voltaren  gel. He does not follow with orthopedics.  Sensory polyneuropathy, ataxia s/p head injury: Managed with gabapentin . He follows with neurology.  Review of Systems     Past Medical History:  Diagnosis Date   Medical history non-contributory     Current Outpatient Medications  Medication Sig Dispense Refill   atorvastatin  (LIPITOR) 10 MG tablet TAKE 1 TABLET BY MOUTH EVERY DAY 90 tablet 1   Cholecalciferol (VITAMIN D3 PO) Take by mouth.     Cod Liver Oil 1000 MG CAPS Take by mouth.     Cyanocobalamin (B-12 PO) Take by mouth.     diclofenac  Sodium (VOLTAREN ) 1 % GEL Apply 2 g topically every 8 (eight) hours as needed. 20 g 0   gabapentin  (NEURONTIN ) 300 MG capsule TAKE 1 CAPSULE BY MOUTH TWICE A DAY 60 capsule 5   No current facility-administered medications for this visit.    No Known Allergies  Family History  Problem Relation Age of Onset   Hypertension Mother    Prostate cancer Father 25   Hypertension Brother    Breast cancer Sister 89    Social History   Socioeconomic History   Marital status: Married    Spouse name: Not on file   Number of children: 3   Years of education: Not on file   Highest education level: High school graduate  Occupational History   Occupation: cabinets    Comment: [employer]  Tobacco Use   Smoking status: Never   Smokeless tobacco: Never  Vaping Use   Vaping status: Never Used  Substance and Sexual  Activity   Alcohol use: Not Currently    Comment: on weekends   Drug use: Yes    Types: Marijuana    Comment: 2x per month over last 10 years   Sexual activity: Yes    Birth control/protection: None  Other Topics Concern   Not on file  Social History Narrative   ** Merged History Encounter **       Social Drivers of Health   Tobacco Use: Low Risk (04/28/2024)   Patient History    Smoking Tobacco Use: Never    Smokeless Tobacco Use: Never    Passive Exposure: Not on file  Financial Resource Strain: High Risk (09/22/2023)   Received from [facility]   Overall Financial Resource Strain (CARDIA)    Difficulty of Paying Living Expenses: Hard  Food Insecurity: Food Insecurity Present (09/22/2023)   Received from [facility]   Epic    Within the past 12 months, you worried that your food would run out before you got the money to buy more.: Sometimes true    Within the past 12 months, the food you bought just didn't last and you didn't have money to get more.: Sometimes true  Transportation Needs: No Transportation Needs (09/22/2023)   Received from [facility]  PRAPARE - Transportation    In the past 12 months, has lack of transportation kept you from medical appointments or from getting medications?: No    Lack of Transportation (Non-Medical): No  Physical Activity: Not on file  Stress: Not on file  Social Connections: Not on file  Intimate Partner Violence: Not on file  Depression (PHQ2-9): Low Risk (02/25/2024)   Depression (PHQ2-9)    PHQ-2 Score: 2  Alcohol Screen: Low Risk (12/03/2022)   Alcohol Screen    Last Alcohol Screening Score (AUDIT): 3  Housing: Low Risk  (09/22/2023)   Received [facility]   Epic    In the last 12 months, was there a time when you were not able to pay the mortgage or rent on time?: No    In the past 12 months, how many times have you moved where you were living?: 0    At  any time in the past 12 months, were you homeless or living in a shelter (including now)?: No  Utilities: Not At Risk (09/22/2023)   Received from [facility] Utilities    Threatened with loss of utilities: No  Health Literacy: Not on file     Constitutional: Denies fever, malaise, fatigue, headache or abrupt weight changes.  HEENT: Denies eye pain, eye redness, ear pain, ringing in the ears, wax buildup, runny nose, nasal congestion, bloody nose, or sore throat. Respiratory: Denies difficulty breathing, shortness of breath, cough or sputum production.   Cardiovascular: Denies chest pain, chest tightness, palpitations or swelling in the hands or feet.  Gastrointestinal: Denies abdominal pain, bloating, constipation, diarrhea or blood in the stool.  GU: Denies urgency, frequency, pain with urination, burning sensation, blood in urine, odor or discharge. Musculoskeletal: Pt reports intermittent shoulder pain, prominence of his left collar bone. Denies decrease in range of motion, difficulty with gait, muscle pain or joint swelling.  Skin: Denies redness, rashes, lesions or ulcercations.  Neurological: Pt reports neuropathic pain of left leg. Denies dizziness, difficulty with memory, difficulty with speech or problems with balance and coordination.  Psych: Denies anxiety, depression, SI/HI.  No other specific complaints in a complete review of systems (except as listed in HPI above).  Objective:   Physical Exam  BP 114/70 (BP Location: Left Arm, Patient Position: Sitting, Cuff Size: Normal)   Ht 6' 2 (1.88 m)   Wt 148 lb (67.1 kg)   BMI 19.00 kg/m    Wt Readings from Last 3 Encounters:  04/28/24 145 lb 4.5 oz (65.9 kg)  02/25/24 145 lb 5 oz (65.9 kg)  08/31/23 147 lb 9.6 oz (67 kg)    General: Appears his stated age, well developed, well nourished in NAD. Skin: Warm, dry and intact.  HEENT: Head: normal shape and size; Eyes: sclera white, no icterus,  conjunctiva pink, PERRLA and EOMs intact;  Cardiovascular: Normal rate and rhythm. S1,S2 noted.  No murmur, rubs or gallops noted. No JVD or BLE edema. No carotid bruits noted. Pulmonary/Chest: Normal effort and positive vesicular breath sounds. No respiratory distress. No wheezes, rales or ronchi noted.  Musculoskeletal: Prominence noted of the left sternoclavicular junction.  Strength 5/5 BUE/BLE. No difficulty with gait.  Neurological: Alert and oriented. Coordination normal.    BMET    Component Value Date/Time   NA 140 02/25/2024 1103   K 4.7 02/25/2024 1103   CL 106 02/25/2024 1103   CO2 28 02/25/2024 1103   GLUCOSE 106 (H) 02/25/2024 1103  BUN 15 02/25/2024 1103   CREATININE 0.89 02/25/2024 1103   CALCIUM  9.5 02/25/2024 1103   GFRNONAA >60 11/07/2023 1945   GFRNONAA 95 10/05/2019 1036   GFRAA 111 10/05/2019 1036    Lipid Panel     Component Value Date/Time   CHOL 146 02/25/2024 1103   TRIG 64 02/25/2024 1103   HDL 51 02/25/2024 1103   CHOLHDL 2.9 02/25/2024 1103   LDLCALC 81 02/25/2024 1103    CBC    Component Value Date/Time   WBC 4.5 02/25/2024 1103   RBC 4.44 02/25/2024 1103   HGB 13.0 (L) 02/25/2024 1103   HCT 40.7 02/25/2024 1103   PLT 225 02/25/2024 1103   MCV 91.7 02/25/2024 1103   MCH 29.3 02/25/2024 1103   MCHC 31.9 (L) 02/25/2024 1103   RDW 11.9 02/25/2024 1103   LYMPHSABS 2.8 11/07/2023 1945   MONOABS 0.8 11/07/2023 1945   EOSABS 0.1 11/07/2023 1945   BASOSABS 0.0 11/07/2023 1945    Hgb A1C Lab Results  Component Value Date   HGBA1C 6.0 (H) 02/25/2024           Assessment & Plan:     RTC in 6 months for your annual exam Angeline Laura, NP   "

## 2024-08-26 LAB — CBC
HCT: 41.5 % (ref 39.4–51.1)
Hemoglobin: 13.3 g/dL (ref 13.2–17.1)
MCH: 29.4 pg (ref 27.0–33.0)
MCHC: 32 g/dL (ref 31.6–35.4)
MCV: 91.8 fL (ref 81.4–101.7)
MPV: 11.3 fL (ref 7.5–12.5)
Platelets: 214 10*3/uL (ref 140–400)
RBC: 4.52 Million/uL (ref 4.20–5.80)
RDW: 12.3 % (ref 11.0–15.0)
WBC: 4 10*3/uL (ref 3.8–10.8)

## 2024-08-26 LAB — COMPREHENSIVE METABOLIC PANEL WITH GFR
AG Ratio: 1.8 (calc) (ref 1.0–2.5)
ALT: 24 U/L (ref 9–46)
AST: 21 U/L (ref 10–35)
Albumin: 4.2 g/dL (ref 3.6–5.1)
Alkaline phosphatase (APISO): 50 U/L (ref 35–144)
BUN: 20 mg/dL (ref 7–25)
CO2: 30 mmol/L (ref 20–32)
Calcium: 9.5 mg/dL (ref 8.6–10.3)
Chloride: 105 mmol/L (ref 98–110)
Creat: 1 mg/dL (ref 0.70–1.30)
Globulin: 2.3 g/dL (ref 1.9–3.7)
Glucose, Bld: 88 mg/dL (ref 65–139)
Potassium: 4.9 mmol/L (ref 3.5–5.3)
Sodium: 140 mmol/L (ref 135–146)
Total Bilirubin: 0.4 mg/dL (ref 0.2–1.2)
Total Protein: 6.5 g/dL (ref 6.1–8.1)
eGFR: 89 mL/min/{1.73_m2}

## 2024-08-26 LAB — LIPID PANEL
Cholesterol: 148 mg/dL
HDL: 50 mg/dL
LDL Cholesterol (Calc): 77 mg/dL
Non-HDL Cholesterol (Calc): 98 mg/dL
Total CHOL/HDL Ratio: 3 (calc)
Triglycerides: 128 mg/dL

## 2024-08-26 LAB — HEMOGLOBIN A1C
Hgb A1c MFr Bld: 5.8 % — ABNORMAL HIGH
Mean Plasma Glucose: 120 mg/dL
eAG (mmol/L): 6.6 mmol/L

## 2024-08-28 ENCOUNTER — Ambulatory Visit: Payer: Self-pay | Admitting: Internal Medicine
